# Patient Record
Sex: Male | Born: 1980 | Race: Black or African American | Hispanic: No | Marital: Married | Smoking: Current every day smoker
Health system: Southern US, Community
[De-identification: ages and names within clinical notes are randomized; demographics above are authoritative.]

## PROBLEM LIST (undated history)

## (undated) DIAGNOSIS — D573 Sickle-cell trait: Secondary | ICD-10-CM

## (undated) DIAGNOSIS — I428 Other cardiomyopathies: Secondary | ICD-10-CM

## (undated) DIAGNOSIS — Z91148 Patient's other noncompliance with medication regimen for other reason: Secondary | ICD-10-CM

## (undated) DIAGNOSIS — I420 Dilated cardiomyopathy: Secondary | ICD-10-CM

## (undated) DIAGNOSIS — M729 Fibroblastic disorder, unspecified: Secondary | ICD-10-CM

## (undated) DIAGNOSIS — I10 Essential (primary) hypertension: Secondary | ICD-10-CM

## (undated) DIAGNOSIS — Z9889 Other specified postprocedural states: Secondary | ICD-10-CM

## (undated) DIAGNOSIS — Z9114 Patient's other noncompliance with medication regimen: Secondary | ICD-10-CM

---

## 2005-10-13 ENCOUNTER — Emergency Department (HOSPITAL_COMMUNITY): Admission: EM | Admit: 2005-10-13 | Discharge: 2005-10-13 | Payer: Self-pay | Admitting: Emergency Medicine

## 2005-10-23 ENCOUNTER — Emergency Department (HOSPITAL_COMMUNITY): Admission: EM | Admit: 2005-10-23 | Discharge: 2005-10-23 | Payer: Self-pay | Admitting: Emergency Medicine

## 2005-10-25 ENCOUNTER — Emergency Department (HOSPITAL_COMMUNITY): Admission: EM | Admit: 2005-10-25 | Discharge: 2005-10-25 | Payer: Self-pay | Admitting: Emergency Medicine

## 2005-11-12 ENCOUNTER — Emergency Department (HOSPITAL_COMMUNITY): Admission: EM | Admit: 2005-11-12 | Discharge: 2005-11-12 | Payer: Self-pay | Admitting: Emergency Medicine

## 2005-11-14 ENCOUNTER — Emergency Department (HOSPITAL_COMMUNITY): Admission: EM | Admit: 2005-11-14 | Discharge: 2005-11-14 | Payer: Self-pay | Admitting: Emergency Medicine

## 2005-11-25 ENCOUNTER — Emergency Department (HOSPITAL_COMMUNITY): Admission: EM | Admit: 2005-11-25 | Discharge: 2005-11-25 | Payer: Self-pay | Admitting: Emergency Medicine

## 2005-11-27 ENCOUNTER — Emergency Department (HOSPITAL_COMMUNITY): Admission: EM | Admit: 2005-11-27 | Discharge: 2005-11-27 | Payer: Self-pay | Admitting: Emergency Medicine

## 2005-12-25 ENCOUNTER — Emergency Department (HOSPITAL_COMMUNITY): Admission: EM | Admit: 2005-12-25 | Discharge: 2005-12-25 | Payer: Self-pay | Admitting: Emergency Medicine

## 2006-01-12 ENCOUNTER — Emergency Department (HOSPITAL_COMMUNITY): Admission: EM | Admit: 2006-01-12 | Discharge: 2006-01-12 | Payer: Self-pay | Admitting: Emergency Medicine

## 2006-02-17 ENCOUNTER — Emergency Department (HOSPITAL_COMMUNITY): Admission: EM | Admit: 2006-02-17 | Discharge: 2006-02-17 | Payer: Self-pay | Admitting: Emergency Medicine

## 2015-01-18 ENCOUNTER — Emergency Department (HOSPITAL_COMMUNITY)
Admission: EM | Admit: 2015-01-18 | Discharge: 2015-01-19 | Disposition: A | Discharge status: Home / Self Care | Payer: Self-pay | Attending: Emergency Medicine | Admitting: Emergency Medicine

## 2015-01-18 ENCOUNTER — Encounter (HOSPITAL_COMMUNITY): Payer: Self-pay | Admitting: Emergency Medicine

## 2015-01-18 DIAGNOSIS — I10 Essential (primary) hypertension: Secondary | ICD-10-CM | POA: Insufficient documentation

## 2015-01-18 DIAGNOSIS — H538 Other visual disturbances: Secondary | ICD-10-CM | POA: Insufficient documentation

## 2015-01-18 DIAGNOSIS — Z720 Tobacco use: Secondary | ICD-10-CM | POA: Insufficient documentation

## 2015-01-18 LAB — CBC WITH DIFFERENTIAL/PLATELET
Basophils Absolute: 0.1 10*3/uL (ref 0.0–0.1)
Basophils Relative: 1 % (ref 0–1)
Eosinophils Absolute: 0.2 10*3/uL (ref 0.0–0.7)
Eosinophils Relative: 3 % (ref 0–5)
HCT: 49.1 % (ref 39.0–52.0)
Hemoglobin: 17.4 g/dL — ABNORMAL HIGH (ref 13.0–17.0)
Lymphocytes Relative: 32 % (ref 12–46)
Lymphs Abs: 1.9 10*3/uL (ref 0.7–4.0)
MCH: 30.7 pg (ref 26.0–34.0)
MCHC: 35.4 g/dL (ref 30.0–36.0)
MCV: 86.6 fL (ref 78.0–100.0)
Monocytes Absolute: 0.5 10*3/uL (ref 0.1–1.0)
Monocytes Relative: 9 % (ref 3–12)
Neutro Abs: 3.4 10*3/uL (ref 1.7–7.7)
Neutrophils Relative %: 55 % (ref 43–77)
Platelets: 170 10*3/uL (ref 150–400)
RBC: 5.67 MIL/uL (ref 4.22–5.81)
RDW: 14.1 % (ref 11.5–15.5)
WBC: 6.1 10*3/uL (ref 4.0–10.5)

## 2015-01-18 NOTE — ED Notes (Signed)
Patient reports he thinks his blood pressure is high. States he has never taken any medication for high blood pressure. Patient reports speaks of left eye and reports, "my vision got blurry and then I couldn't see out of it." States this happened 3 weeks ago.

## 2015-01-18 NOTE — ED Notes (Signed)
C/o pain at site of a "lump" just below left nipple, has been there for 2 months, also c/o blurry vision in right eye for 3-4 months. Also states he's been "peeing twice as much as he usually does. Also concerned about his insurance. States his wife is also here in the ER and "If I had to bring her up here I might as well get this all checked out"

## 2015-01-18 NOTE — ED Provider Notes (Signed)
CSN: [id number]     Arrival date & time 01/18/15  2146 History  This chart was scribed for John Rhine, MD by Tonye Royalty, ED Scribe. This patient was seen in room [facility] and the patient's care was started at 11:51 PM.    Chief Complaint  Patient presents with  . Hypertension  . Loss of Vision   Patient is a 34 y.o. male presenting with eye problem. The history is provided by the patient. No language interpreter was used.  Eye Problem Location:  L eye Severity:  Severe Onset quality:  Gradual Duration:  3 weeks Timing:  Constant Progression:  Worsening Chronicity:  New Context: not direct trauma   Relieved by:  None tried Worsened by:  Nothing tried Ineffective treatments:  None tried Associated symptoms: blurred vision and decreased vision   Associated symptoms: no headaches   Risk factors: no previous injury to eye     HPI Comments: John Cain is a 34 y.o. male who presents to the Emergency Department complaining of loss of vision in his left eye with onset 3 weeks ago. He states he is unable to make anything out, everything appears as a shadow. He states it worsened gradually and intermittently. He states vision was 20/20 before and denies injury.  He does not wear corrective lenses.  He stateas he has some weakness and tingling in left arm with onset 3 weeks ago that is resolved. He also reports lumps to lower left chest with onset 2 months ago, one of which is painful; he notes history of neurofibromatosis. He denies prior MI or CVA. He does not have a PCP. He states he has never used medication for HTN. His wife was being seen here, so he "figured [he] might as well get checked out." He denies headache, chest pain, or SOB.    PMH - suspect neurofibromatosis History  Substance Use Topics  . Smoking status: Current Every Day Smoker  . Smokeless tobacco: Not on file  . Alcohol Use: Yes     Comment: patient reports i drink on my days off, 2-3 days a week    Review  of Systems  Constitutional: Negative for fever.  Eyes: Positive for blurred vision and visual disturbance.  Respiratory: Negative for shortness of breath.   Cardiovascular: Negative for chest pain.  Neurological: Negative for headaches.  All other systems reviewed and are negative.     Allergies  Bee venom  Home Medications   Prior to Admission medications   Not on File   BP 164/118 mmHg  Pulse 108  Temp(Src) 97.8 F (36.6 C) (Oral)  Resp 20  Ht  (1.88 m)  Wt 210 lb (95.255 kg)  BMI 26.95 kg/m2  SpO2 99% Physical Exam  Nursing note and vitals reviewed.  CONSTITUTIONAL: Well developed/well nourished HEAD: Normocephalic/atraumatic EYES: EOMI/PERRL, no nystagmus,no ptosis, no corneal hazing, no evidence of trauma, no papilledema, no obvious abnormality noted on limited fundoscopic exam ENMT: Mucous membranes moist NECK: supple no meningeal signs, no bruits CV: S1/S2 noted, no murmurs/rubs/gallops noted LUNGS: Lungs are clear to auscultation bilaterally, no apparent distress ABDOMEN: soft, nontender, no rebound or guarding GU:no cva tenderness NEURO:Awake/alert, facies symmetric, no arm or leg drift is noted Equal 5/5 strength with shoulder abduction, elbow flex/extension, wrist flex/extension in upper extremities and equal hand grips bilaterally Equal 5/5 strength with hip flexion,knee flex/extension, foot dorsi/plantar flexion Cranial nerves grossly intact Gait normal without ataxia No past pointing Sensation to light touch intact in all extremities  EXTREMITIES: pulses normal, full ROM SKIN: warm, color normal.  No abscess/cellulitis noted to chest.  Scatted lesions throughoutt body, likely from NF PSYCH: no abnormalities of mood noted   ED Course  Procedures   DIAGNOSTIC STUDIES: Oxygen Saturation is 99% on room air, normal by my interpretation.    COORDINATION OF CARE: 12:00 AM Discussed treatment plan with patient at beside, including CT scan of his  head and follow ups with PCP, neurologist, and eye specialist. The patient agrees with the plan and has no further questions at this time.  Visual acuity noted/reviewed Pt ambulatory and tried to leave several times prior to completion of workup Unclear cause of vision loss.  He has no obvious cause on my exam,however he needs detailed NEURO and EYE followup.  Also advised need to have his likely HTN managed and needs PCP.   Other than visual loss, he has no signs of acute CVA as no weakness/numbness on my evaluation  Labs Review Labs Reviewed  COMPREHENSIVE METABOLIC PANEL - Abnormal; Notable for the following:    Glucose, Bld 102 (*)    All other components within normal limits  CBC WITH DIFFERENTIAL/PLATELET - Abnormal; Notable for the following:    Hemoglobin 17.4 (*)    All other components within normal limits    Imaging Review Ct Head Wo Contrast  01/19/2015   CLINICAL DATA:  Subacute onset of left-sided visual loss, with left arm weakness and tingling. Initial encounter.  EXAM: CT HEAD WITHOUT CONTRAST  TECHNIQUE: Contiguous axial images were obtained from the base of the skull through the vertex without intravenous contrast.  COMPARISON:  None.  FINDINGS: There is no evidence of acute infarction, mass lesion, or intra- or extra-axial hemorrhage on CT.  The posterior fossa, including the cerebellum, brainstem and fourth ventricle, is within normal limits. The third and lateral ventricles, and basal ganglia are unremarkable in appearance. The cerebral hemispheres are symmetric in appearance, with normal gray-white differentiation. No mass effect or midline shift is seen.  There is no evidence of fracture; visualized osseous structures are unremarkable in appearance. The orbits are within normal limits. The paranasal sinuses and mastoid air cells are well-aerated. No significant soft tissue abnormalities are seen.  IMPRESSION: Unremarkable noncontrast CT of the head.   Electronically Signed    By: Roanna Raider M.D.   On: 01/19/2015 01:00    MDM   Final diagnoses:  Essential hypertension  Blurred vision    Nursing notes including past medical history and social history reviewed and considered in documentation Labs/vital reviewed myself and considered during evaluation    I personally performed the services described in this documentation, which was scribed in my presence. The recorded information has been reviewed and is accurate.      John Rhine, MD 01/19/15 [phone]

## 2015-01-19 ENCOUNTER — Emergency Department (HOSPITAL_COMMUNITY): Payer: Self-pay

## 2015-01-19 LAB — COMPREHENSIVE METABOLIC PANEL
ALT: 53 U/L (ref 0–53)
AST: 27 U/L (ref 0–37)
Albumin: 4.9 g/dL (ref 3.5–5.2)
Alkaline Phosphatase: 54 U/L (ref 39–117)
Anion gap: 9 (ref 5–15)
BUN: 12 mg/dL (ref 6–23)
CO2: 25 mmol/L (ref 19–32)
Calcium: 8.9 mg/dL (ref 8.4–10.5)
Chloride: 107 mmol/L (ref 96–112)
Creatinine, Ser: 0.93 mg/dL (ref 0.50–1.35)
GFR calc Af Amer: >90 mL/min (ref >90)
GFR calc non Af Amer: >90 mL/min (ref >90)
Glucose, Bld: 102 mg/dL — ABNORMAL HIGH (ref 70–99)
Potassium: 4.4 mmol/L (ref 3.5–5.1)
Sodium: 141 mmol/L (ref 135–145)
Total Bilirubin: 0.6 mg/dL (ref 0.3–1.2)
Total Protein: 8 g/dL (ref 6.0–8.3)

## 2015-01-19 NOTE — ED Notes (Signed)
Went over labs with patient and stressed the importance of establishing himself with a primary doctor to manage his hypertension. Pt states both his wife and another family member who is a nurse have been after him to quit drinking and get his blood pressure checked. I encouraged him to listen to them. Pt states he only smokes when he drinks and acknowledges that he probably should quit drinking. States he will make appt with referrals given.

## 2015-10-19 DIAGNOSIS — Z9889 Other specified postprocedural states: Secondary | ICD-10-CM

## 2015-10-19 HISTORY — DX: Other specified postprocedural states: Z98.890

## 2016-01-27 ENCOUNTER — Inpatient Hospital Stay (HOSPITAL_COMMUNITY)
Admission: EM | Admit: 2016-01-27 | Discharge: 2016-01-30 | DRG: 287 | Disposition: A | Discharge status: Home / Self Care | Payer: PRIVATE HEALTH INSURANCE | Attending: Internal Medicine | Admitting: Internal Medicine

## 2016-01-27 ENCOUNTER — Emergency Department (HOSPITAL_COMMUNITY): Payer: PRIVATE HEALTH INSURANCE

## 2016-01-27 ENCOUNTER — Encounter (HOSPITAL_COMMUNITY): Payer: Self-pay | Admitting: Emergency Medicine

## 2016-01-27 DIAGNOSIS — F17200 Nicotine dependence, unspecified, uncomplicated: Secondary | ICD-10-CM | POA: Diagnosis present

## 2016-01-27 DIAGNOSIS — I160 Hypertensive urgency: Secondary | ICD-10-CM | POA: Diagnosis present

## 2016-01-27 DIAGNOSIS — R0902 Hypoxemia: Secondary | ICD-10-CM

## 2016-01-27 DIAGNOSIS — H21569 Pupillary abnormality, unspecified eye: Secondary | ICD-10-CM | POA: Insufficient documentation

## 2016-01-27 DIAGNOSIS — D649 Anemia, unspecified: Secondary | ICD-10-CM

## 2016-01-27 DIAGNOSIS — H5010 Unspecified exotropia: Secondary | ICD-10-CM | POA: Diagnosis present

## 2016-01-27 DIAGNOSIS — R072 Precordial pain: Secondary | ICD-10-CM

## 2016-01-27 DIAGNOSIS — I429 Cardiomyopathy, unspecified: Secondary | ICD-10-CM

## 2016-01-27 DIAGNOSIS — Q8500 Neurofibromatosis, unspecified: Secondary | ICD-10-CM

## 2016-01-27 DIAGNOSIS — D573 Sickle-cell trait: Secondary | ICD-10-CM | POA: Diagnosis present

## 2016-01-27 DIAGNOSIS — I161 Hypertensive emergency: Secondary | ICD-10-CM | POA: Diagnosis present

## 2016-01-27 DIAGNOSIS — G839 Paralytic syndrome, unspecified: Secondary | ICD-10-CM | POA: Diagnosis present

## 2016-01-27 DIAGNOSIS — R9431 Abnormal electrocardiogram [ECG] [EKG]: Secondary | ICD-10-CM

## 2016-01-27 DIAGNOSIS — Z79899 Other long term (current) drug therapy: Secondary | ICD-10-CM

## 2016-01-27 DIAGNOSIS — I428 Other cardiomyopathies: Secondary | ICD-10-CM | POA: Insufficient documentation

## 2016-01-27 DIAGNOSIS — I420 Dilated cardiomyopathy: Secondary | ICD-10-CM | POA: Diagnosis present

## 2016-01-27 DIAGNOSIS — Z7982 Long term (current) use of aspirin: Secondary | ICD-10-CM

## 2016-01-27 DIAGNOSIS — I10 Essential (primary) hypertension: Secondary | ICD-10-CM

## 2016-01-27 DIAGNOSIS — H4900 Third [oculomotor] nerve palsy, unspecified eye: Secondary | ICD-10-CM

## 2016-01-27 DIAGNOSIS — R079 Chest pain, unspecified: Secondary | ICD-10-CM | POA: Diagnosis present

## 2016-01-27 HISTORY — DX: Sickle-cell trait: D57.3

## 2016-01-27 HISTORY — DX: Fibroblastic disorder, unspecified: M72.9

## 2016-01-27 LAB — RAPID URINE DRUG SCREEN, HOSP PERFORMED
Amphetamines: NOT DETECTED
Barbiturates: NOT DETECTED
Benzodiazepines: NOT DETECTED
Cocaine: NOT DETECTED
Opiates: NOT DETECTED
Tetrahydrocannabinol: NOT DETECTED

## 2016-01-27 LAB — BRAIN NATRIURETIC PEPTIDE: B Natriuretic Peptide: 205 pg/mL — ABNORMAL HIGH (ref 0.0–100.0)

## 2016-01-27 LAB — I-STAT TROPONIN, ED
Troponin i, poc: 0.02 ng/mL (ref 0.00–0.08)
Troponin i, poc: 0.04 ng/mL (ref 0.00–0.08)

## 2016-01-27 LAB — CBC
HCT: 50.4 % (ref 39.0–52.0)
Hemoglobin: 18 g/dL — ABNORMAL HIGH (ref 13.0–17.0)
MCH: 30.4 pg (ref 26.0–34.0)
MCHC: 35.7 g/dL (ref 30.0–36.0)
MCV: 85 fL (ref 78.0–100.0)
Platelets: 108 10*3/uL — ABNORMAL LOW (ref 150–400)
RBC: 5.93 MIL/uL — ABNORMAL HIGH (ref 4.22–5.81)
RDW: 14.2 % (ref 11.5–15.5)
WBC: 7.8 10*3/uL (ref 4.0–10.5)

## 2016-01-27 LAB — BASIC METABOLIC PANEL
Anion gap: 8 (ref 5–15)
BUN: 11 mg/dL (ref 6–20)
CO2: 22 mmol/L (ref 22–32)
Calcium: 8.6 mg/dL — ABNORMAL LOW (ref 8.9–10.3)
Chloride: 109 mmol/L (ref 101–111)
Creatinine, Ser: 1.05 mg/dL (ref 0.61–1.24)
GFR calc Af Amer: >60 mL/min (ref >60)
GFR calc non Af Amer: >60 mL/min (ref >60)
Glucose, Bld: 118 mg/dL — ABNORMAL HIGH (ref 65–99)
Potassium: 4 mmol/L (ref 3.5–5.1)
Sodium: 139 mmol/L (ref 135–145)

## 2016-01-27 LAB — TROPONIN I: Troponin I: <0.03 ng/mL (ref <0.031)

## 2016-01-27 MED ORDER — NITROGLYCERIN IN D5W 200-5 MCG/ML-% IV SOLN
10.0000 ug/min | INTRAVENOUS | Status: DC
  Administered 2016-01-27: 10 ug/min via INTRAVENOUS
  Administered 2016-01-27: 40 ug/min via INTRAVENOUS
  Filled 2016-01-27: qty 250

## 2016-01-27 MED ORDER — LABETALOL HCL 5 MG/ML IV SOLN
10.0000 mg | INTRAVENOUS | Status: DC | PRN
  Administered 2016-01-27: 10 mg via INTRAVENOUS
  Filled 2016-01-27: qty 4

## 2016-01-27 MED ORDER — NITROGLYCERIN 0.4 MG SL SUBL
0.4000 mg | SUBLINGUAL_TABLET | SUBLINGUAL | Status: AC | PRN
  Administered 2016-01-27 (×3): 0.4 mg via SUBLINGUAL
  Filled 2016-01-27: qty 1

## 2016-01-27 MED ORDER — IOPAMIDOL (ISOVUE-370) INJECTION 76%
100.0000 mL | Freq: Once | INTRAVENOUS | Status: AC | PRN
  Administered 2016-01-27: 100 mL via INTRAVENOUS

## 2016-01-27 MED ORDER — ASPIRIN 81 MG PO CHEW
324.0000 mg | CHEWABLE_TABLET | Freq: Once | ORAL | Status: AC
  Administered 2016-01-27: 324 mg via ORAL
  Filled 2016-01-27: qty 4

## 2016-01-27 NOTE — H&P (Signed)
HPI: 35 yo man with h/o sickle trait and neurofibromatosis who presents as a transfer from [facility] with chest pain.  He reports pain started this morning around 10am, severe heaviness in chest with associated SOB.  No other symptoms.  Discomfort was severe enough that he went to ED for evaluation.  On arrival, vitals 173/116, HR 116 and ECG with ST, LAD, and LVH with repolarization abnormalities.  Initial labs notable for elevated Hg, negative troponin and UDS.  CTA chest also negative.  Case discussed with cardiology earlier today and patient transferred.  On arrival here he is on a NTG drip at 40 mcg, hypertensive and chest pain free.  He states that his pain resolved about 2 hours prior to arriving here.  Of note, no known family history as he is adopted.     Review of Systems:     Cardiac Review of Systems: {Y] = yes  = no  Chest Pain [    ]  Resting SOB [   ] Exertional SOB  [  ]  Orthopnea [  ]   Pedal Edema [   ]    Palpitations [  ] Syncope  [  ]   Presyncope [   ]  General Review of Systems: [Y] = yes [  ]=no Constitional: recent weight change [  ]; anorexia [  ]; fatigue [  ]; nausea [  ]; night sweats [  ]; fever [  ]; or chills [  ];                                                                     Dental: poor dentition[  ];   Eye : blurred vision [  ]; diplopia [   ]; vision changes [  ];  Amaurosis fugax[  ]; Resp: cough [  ];  wheezing[  ];  hemoptysis[  ]; shortness of breath[  ]; paroxysmal nocturnal dyspnea[  ]; dyspnea on exertion[  ]; or orthopnea[  ];  GI:  gallstones[  ], vomiting[  ];  dysphagia[  ]; melena[  ];  hematochezia [  ]; heartburn[  ];   GU: kidney stones [  ]; hematuria[  ];   dysuria [  ];  nocturia[  ];               Skin: rash [  ], swelling[  ];, hair loss[  ];  peripheral edema[  ];  or itching[  ]; Musculosketetal: myalgias[  ];  joint swelling[  ];  joint erythema[  ];  joint pain[  ];  back pain[  ];  Heme/Lymph: bruising[  ];  bleeding[  ];   anemia[  ];  Neuro: TIA[  ];  headaches[  ];  stroke[  ];  vertigo[  ];  seizures[  ];   paresthesias[  ];  difficulty walking[  ];  Psych:depression[  ]; anxiety[  ];  Endocrine: diabetes[  ];  thyroid dysfunction[  ];  Other:  Past Medical History  Diagnosis Date  . Sickle cell trait (HCC)   . Fibromatosis     No current facility-administered medications on file prior to encounter.   No current outpatient prescriptions on file prior to encounter.  Allergies  Allergen Reactions  . Bee Venom Hives    Social History   Social History  . Marital Status: Married    Spouse Name: N/A  . Number of Children: N/A  . Years of Education: N/A   Occupational History  . Not on file.   Social History Main Topics  . Smoking status: Current Every Day Smoker  . Smokeless tobacco: Not on file  . Alcohol Use: Yes     Comment: patient reports i drink on my days off, 2-3 days a week  . Drug Use: No  . Sexual Activity: Not on file   Other Topics Concern  . Not on file   Social History Narrative    History reviewed. No pertinent family history.  PHYSICAL EXAM: Filed Vitals:   01/27/16 2215 01/27/16 2230  BP: 151/121 165/113  Pulse: 99 98  Temp:    Resp: 26 25   General:  Well appearing. No respiratory difficulty.  Overweight.  HEENT: AT, [place], PERRL, EOMI, anicteric, clear OP, moist MM Neck: supple. no JVD. Carotids 2+ bilat; no bruits. No lymphadenopathy or thryomegaly appreciated. Cor: PMI nondisplaced. Regular rate & rhythm. No rubs, murmurs.  +S4 gallop with normal S1, S2 Lungs: clear Abdomen: soft, nontender, nondistended. No hepatosplenomegaly. No bruits or masses. Good bowel sounds. Extremities: no cyanosis, clubbing, rash, edema Neuro: alert & oriented x 3, cranial nerves grossly intact. moves all 4 extremities w/o difficulty. Affect pleasant.  ECG: SR, LAD, LVH with repol abn.  Results for orders placed or performed during the hospital encounter of 01/27/16  (from the past 24 hour(s))  Basic metabolic panel     Status: Abnormal   Collection Time: 01/27/16  2:34 PM  Result Value Ref Range   Sodium 139 135 - 145 mmol/L   Potassium 4.0 3.5 - 5.1 mmol/L   Chloride 109 101 - 111 mmol/L   CO2 22 22 - 32 mmol/L   Glucose, Bld 118 (H) 65 - 99 mg/dL   BUN 11 6 - 20 mg/dL   Creatinine, Ser 0.98 0.61 - 1.24 mg/dL   Calcium 8.6 (L) 8.9 - 10.3 mg/dL   GFR calc non Af Amer >60 >60 mL/min   GFR calc Af Amer >60 >60 mL/min   Anion gap 8 5 - 15  CBC     Status: Abnormal   Collection Time: 01/27/16  2:34 PM  Result Value Ref Range   WBC 7.8 4.0 - 10.5 K/uL   RBC 5.93 (H) 4.22 - 5.81 MIL/uL   Hemoglobin 18.0 (H) 13.0 - 17.0 g/dL   HCT 11.9 14.7 - 82.9 %   MCV 85.0 78.0 - 100.0 fL   MCH 30.4 26.0 - 34.0 pg   MCHC 35.7 30.0 - 36.0 g/dL   RDW 56.2 13.0 - 86.5 %   Platelets 108 (L) 150 - 400 K/uL  Troponin I     Status: None   Collection Time: 01/27/16  2:34 PM  Result Value Ref Range   Troponin I <0.03 <0.031 ng/mL  I-stat troponin, ED     Status: None   Collection Time: 01/27/16  2:50 PM  Result Value Ref Range   Troponin i, poc 0.02 0.00 - 0.08 ng/mL   Comment 3          Brain natriuretic peptide     Status: Abnormal   Collection Time: 01/27/16  2:51 PM  Result Value Ref Range   B Natriuretic Peptide 205.0 (H) 0.0 - 100.0 pg/mL  Urine rapid  drug screen (hosp performed)     Status: None   Collection Time: 01/27/16  3:34 PM  Result Value Ref Range   Opiates NONE DETECTED NONE DETECTED   Cocaine NONE DETECTED NONE DETECTED   Benzodiazepines NONE DETECTED NONE DETECTED   Amphetamines NONE DETECTED NONE DETECTED   Tetrahydrocannabinol NONE DETECTED NONE DETECTED   Barbiturates NONE DETECTED NONE DETECTED  I-stat troponin, ED     Status: None   Collection Time: 01/27/16  6:33 PM  Result Value Ref Range   Troponin i, poc 0.04 0.00 - 0.08 ng/mL   Comment 3           Ct Angio Chest Pe W/cm &/or Wo Cm  01/27/2016  CLINICAL DATA:  Chest pain and  shortness of Breath EXAM: CT ANGIOGRAPHY CHEST WITH CONTRAST TECHNIQUE: Multidetector CT imaging of the chest was performed using the standard protocol during bolus administration of intravenous contrast. Multiplanar CT image reconstructions and MIPs were obtained to evaluate the vascular anatomy. CONTRAST:  100 mL Isovue 370. COMPARISON:  None. FINDINGS: Lungs are well aerated bilaterally. No focal infiltrate or sizable effusion is seen. Diffuse emphysematous changes are seen. The thoracic inlet is within normal limits. The thoracic aorta shows no aneurysmal dilatation or dissection. The pulmonary artery shows a normal branching pattern without focal filling defect to suggest pulmonary embolism. No significant hilar or mediastinal adenopathy is noted. A few small aortic or pulmonary window nodes are seen. Scanning into the upper abdomen shows no acute abnormality. The bony structures show no acute abnormality. Review of the MIP images confirms the above findings. IMPRESSION: No evidence of pulmonary emboli. Diffuse emphysematous changes. Electronically Signed   By: Alcide Clever M.D.   On: 01/27/2016 17:53   Dg Chest Port 1 View  01/27/2016  CLINICAL DATA:  Chest pain EXAM: PORTABLE CHEST 1 VIEW COMPARISON:  None. FINDINGS: Cardiac shadow is within normal limits. The lungs are well aerated bilaterally. Increased density is noted in the medial right lung base which may represent some early infiltrate. No acute bony abnormality is noted. IMPRESSION: Likely early infiltrate in the medial right lung base. Electronically Signed   By: Alcide Clever M.D.   On: 01/27/2016 15:49     ASSESSMENT: 35 yo man with h/o sickle trait and neurofibromatosis who presents with CP in setting of markedly elevated BP.  He has LVH on ECG which makes undiagnosed/untreated hypertension likely.  His CP is likely due to hypertensive urgency as it lasted close to 12 hours with negative troponins.    PLAN/DISCUSSION: Continue NTG drip,  titrate for BP Will add labetalol 10 mg IV prn HCTZ 25 mg po starting now Norvasc 10 mg po starting now Will need echo given HTN, LVH on ECG and S4 gallop on exam Check troponin x 1 now Risk stratify with A1c and lipids VTE ppx Will likely need ischemic eval, would suggest non-invasive testing once BP better controlled.

## 2016-01-27 NOTE — ED Notes (Signed)
Woke up this am with chest pain, discomfort is a heaviness.  No radiating and c/o SOB.

## 2016-01-27 NOTE — ED Provider Notes (Addendum)
CSN: [id number]     Arrival date & time 01/27/16  1421 History   First MD Initiated Contact with Patient 01/27/16 1435     Chief Complaint  Patient presents with  . Chest Pain     (Consider location/radiation/quality/duration/timing/severity/associated sxs/prior Treatment) HPI Patient presents with left-sided chest pressure starting this morning. It was associated with shortness of breath. Pressure is worse when lying flat. Also increased dyspnea with any exertion. Nonproductive cough endorse today. No fever or chills. No recent illness. No known sick contacts. No lower extremity asymmetry or swelling. No recent extended travel or immobilization. Unknown family history due to being adopted. She does not smoke 6-7 cigarettes a day for the last 15 years. Has a history of high blood pressure but is currently on no medications for this. History reviewed. No pertinent past medical history. History reviewed. No pertinent past surgical history. History reviewed. No pertinent family history. Social History  Substance Use Topics  . Smoking status: Current Every Day Smoker  . Smokeless tobacco: None  . Alcohol Use: Yes     Comment: patient reports i drink on my days off, 2-3 days a week    Review of Systems  Constitutional: Negative for fever and chills.  Eyes: Negative for visual disturbance.  Respiratory: Positive for cough, chest tightness and shortness of breath.   Cardiovascular: Negative for chest pain, palpitations and leg swelling.  Gastrointestinal: Negative for nausea, vomiting, abdominal pain and diarrhea.  Musculoskeletal: Negative for back pain and neck pain.  Skin: Negative for rash and wound.  Neurological: Negative for dizziness, weakness, light-headedness, numbness and headaches.  All other systems reviewed and are negative.     Allergies  Bee venom  Home Medications   Prior to Admission medications   Not on File   BP 153/120 mmHg  Pulse 92  Temp(Src) 98.1 F  (36.7 C) (Oral)  Resp 28  Ht 6\' 2"  (1.88 m)  Wt 230 lb (104.327 kg)  BMI 29.52 kg/m2  SpO2 100% Physical Exam  Constitutional: He is oriented to person, place, and time. He appears well-developed and well-nourished. No distress.  HENT:  Head: Normocephalic and atraumatic.  Mouth/Throat: Oropharynx is clear and moist.  Eyes: EOM are normal. Pupils are equal, round, and reactive to light.  Neck: Normal range of motion. Neck supple.  Cardiovascular: Regular rhythm.  Exam reveals friction rub.   Tachycardia  Pulmonary/Chest: No respiratory distress. He has no wheezes. He has rales. He exhibits no tenderness.  Patient with fine crackles in the right base. Tachypnea  Abdominal: Soft. Bowel sounds are normal. He exhibits no distension and no mass. There is no tenderness. There is no rebound and no guarding.  Musculoskeletal: Normal range of motion. He exhibits no edema or tenderness.  Swelling, asymmetry or tenderness. Distal pulses are 3+ and equal.  Neurological: He is alert and oriented to person, place, and time.  5/5 motor in all extremities. Sensation is fully intact.  Skin: Skin is warm and dry. No rash noted. No erythema.  Psychiatric: He has a normal mood and affect. His behavior is normal.  Nursing note and vitals reviewed.   ED Course  Procedures (including critical care time) Labs Review Labs Reviewed  BASIC METABOLIC PANEL - Abnormal; Notable for the following:    Glucose, Bld 118 (*)    Calcium 8.6 (*)    All other components within normal limits  CBC - Abnormal; Notable for the following:    RBC 5.93 (*)    Hemoglobin  18.0 (*)    Platelets 108 (*)    All other components within normal limits  BRAIN NATRIURETIC PEPTIDE - Abnormal; Notable for the following:    B Natriuretic Peptide 205.0 (*)    All other components within normal limits  TROPONIN I  URINE RAPID DRUG SCREEN, HOSP PERFORMED  I-STAT TROPOININ, ED  I-STAT TROPOININ, ED    Imaging Review Ct Angio  Chest Pe W/cm &/or Wo Cm  01/27/2016  CLINICAL DATA:  Chest pain and shortness of Breath EXAM: CT ANGIOGRAPHY CHEST WITH CONTRAST TECHNIQUE: Multidetector CT imaging of the chest was performed using the standard protocol during bolus administration of intravenous contrast. Multiplanar CT image reconstructions and MIPs were obtained to evaluate the vascular anatomy. CONTRAST:  100 mL Isovue 370. COMPARISON:  None. FINDINGS: Lungs are well aerated bilaterally. No focal infiltrate or sizable effusion is seen. Diffuse emphysematous changes are seen. The thoracic inlet is within normal limits. The thoracic aorta shows no aneurysmal dilatation or dissection. The pulmonary artery shows a normal branching pattern without focal filling defect to suggest pulmonary embolism. No significant hilar or mediastinal adenopathy is noted. A few small aortic or pulmonary window nodes are seen. Scanning into the upper abdomen shows no acute abnormality. The bony structures show no acute abnormality. Review of the MIP images confirms the above findings. IMPRESSION: No evidence of pulmonary emboli. Diffuse emphysematous changes. Electronically Signed   By: Alcide Clever M.D.   On: 01/27/2016 17:53   Dg Chest Port 1 View  01/27/2016  CLINICAL DATA:  Chest pain EXAM: PORTABLE CHEST 1 VIEW COMPARISON:  None. FINDINGS: Cardiac shadow is within normal limits. The lungs are well aerated bilaterally. Increased density is noted in the medial right lung base which may represent some early infiltrate. No acute bony abnormality is noted. IMPRESSION: Likely early infiltrate in the medial right lung base. Electronically Signed   By: Alcide Clever M.D.   On: 01/27/2016 15:49   I have personally reviewed and evaluated these images and lab results as part of my medical decision-making.   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 There is evidence of T-wave inversion and ST segment depression V6 and V5. Does not meet STEMI criteria. Repeat EKG is unchanged. Patient given sublingual nitroglycerin and aspirin in the emergency department. States that the pressure in his chest has improved significantly. Blood pressure has also normalized. Patient with desaturations into the 80s with mild exertion. Placed on supplement oxygen.  Chest extremity with questionable right basilar infiltrate. CT angios the chest without evidence of PE. No infiltrate noted. Patient states his chest pressure has improved but worsens with exertion. Discussed with Dr. Purvis Sheffield. Accept in transfer to [facility] step down bed.  Loren Racer, MD 01/27/16 1926  CRITICAL CARE Performed by: Loren Racer Total critical care time:50 minutes Critical care time was exclusive of separately billable procedures and treating other patients. Critical care was necessary to treat or  prevent imminent or life-threatening deterioration. Critical care was time spent personally by me on the following activities: development of treatment plan with patient and/or surrogate as well as nursing, discussions with consultants, evaluation of patient's response to treatment, examination of patient, obtaining history from patient or surrogate, ordering and performing treatments and interventions, ordering and review of laboratory studies, ordering and review of radiographic studies, pulse oximetry and re-evaluation of patient's condition.   Loren Racer, MD 01/27/16 [phone]

## 2016-01-28 ENCOUNTER — Inpatient Hospital Stay (HOSPITAL_COMMUNITY): Payer: PRIVATE HEALTH INSURANCE

## 2016-01-28 ENCOUNTER — Encounter (HOSPITAL_COMMUNITY): Payer: Self-pay | Admitting: Radiology

## 2016-01-28 DIAGNOSIS — R072 Precordial pain: Secondary | ICD-10-CM | POA: Diagnosis not present

## 2016-01-28 DIAGNOSIS — H50112 Monocular exotropia, left eye: Secondary | ICD-10-CM | POA: Diagnosis present

## 2016-01-28 DIAGNOSIS — I161 Hypertensive emergency: Principal | ICD-10-CM

## 2016-01-28 DIAGNOSIS — H5010 Unspecified exotropia: Secondary | ICD-10-CM | POA: Diagnosis present

## 2016-01-28 DIAGNOSIS — I10 Essential (primary) hypertension: Secondary | ICD-10-CM | POA: Diagnosis not present

## 2016-01-28 DIAGNOSIS — H21562 Pupillary abnormality, left eye: Secondary | ICD-10-CM | POA: Diagnosis not present

## 2016-01-28 LAB — CBC
HCT: 44 % (ref 39.0–52.0)
Hemoglobin: 15.6 g/dL (ref 13.0–17.0)
MCH: 30.2 pg (ref 26.0–34.0)
MCHC: 35.5 g/dL (ref 30.0–36.0)
MCV: 85.1 fL (ref 78.0–100.0)
Platelets: 136 10*3/uL — ABNORMAL LOW (ref 150–400)
RBC: 5.17 MIL/uL (ref 4.22–5.81)
RDW: 14.3 % (ref 11.5–15.5)
WBC: 8.4 10*3/uL (ref 4.0–10.5)

## 2016-01-28 LAB — BASIC METABOLIC PANEL
Anion gap: 11 (ref 5–15)
BUN: 10 mg/dL (ref 6–20)
CO2: 25 mmol/L (ref 22–32)
Calcium: 9.3 mg/dL (ref 8.9–10.3)
Chloride: 106 mmol/L (ref 101–111)
Creatinine, Ser: 1.19 mg/dL (ref 0.61–1.24)
GFR calc Af Amer: >60 mL/min (ref >60)
GFR calc non Af Amer: >60 mL/min (ref >60)
Glucose, Bld: 137 mg/dL — ABNORMAL HIGH (ref 65–99)
Potassium: 3.7 mmol/L (ref 3.5–5.1)
Sodium: 142 mmol/L (ref 135–145)

## 2016-01-28 LAB — LIPID PANEL
Cholesterol: 211 mg/dL — ABNORMAL HIGH (ref 0–200)
HDL: 79 mg/dL (ref >40)
LDL Cholesterol: 115 mg/dL — ABNORMAL HIGH (ref 0–99)
Total CHOL/HDL Ratio: 2.7 RATIO
Triglycerides: 86 mg/dL (ref <150)
VLDL: 17 mg/dL (ref 0–40)

## 2016-01-28 LAB — TROPONIN I: Troponin I: <0.03 ng/mL (ref <0.031)

## 2016-01-28 LAB — MRSA PCR SCREENING: MRSA by PCR: NEGATIVE

## 2016-01-28 LAB — TSH: TSH: 1.838 u[IU]/mL (ref 0.350–4.500)

## 2016-01-28 LAB — ECHOCARDIOGRAM COMPLETE
Height: 74 in
Weight: 3385.6 oz

## 2016-01-28 MED ORDER — ONDANSETRON HCL 4 MG/2ML IJ SOLN: 4.0000 mg | Freq: Four times a day (QID) | INTRAMUSCULAR | Status: DC | PRN

## 2016-01-28 MED ORDER — ASPIRIN EC 81 MG PO TBEC
81.0000 mg | DELAYED_RELEASE_TABLET | Freq: Every day | ORAL | Status: DC
  Administered 2016-01-28 – 2016-01-30 (×3): 81 mg via ORAL
  Filled 2016-01-28 (×3): qty 1

## 2016-01-28 MED ORDER — ENOXAPARIN SODIUM 40 MG/0.4ML ~~LOC~~ SOLN
40.0000 mg | SUBCUTANEOUS | Status: DC
  Administered 2016-01-28 – 2016-01-30 (×3): 40 mg via SUBCUTANEOUS
  Filled 2016-01-28 (×3): qty 0.4

## 2016-01-28 MED ORDER — LABETALOL HCL 200 MG PO TABS
200.0000 mg | ORAL_TABLET | Freq: Two times a day (BID) | ORAL | Status: DC
  Administered 2016-01-28 (×2): 200 mg via ORAL
  Filled 2016-01-28 (×2): qty 1

## 2016-01-28 MED ORDER — PERFLUTREN LIPID MICROSPHERE
1.0000 mL | INTRAVENOUS | Status: AC | PRN
  Administered 2016-01-28: 2 mL via INTRAVENOUS
  Filled 2016-01-28: qty 10

## 2016-01-28 MED ORDER — IOPAMIDOL (ISOVUE-370) INJECTION 76%
INTRAVENOUS | Status: AC
  Administered 2016-01-28: 50 mL
  Filled 2016-01-28: qty 50

## 2016-01-28 MED ORDER — AMLODIPINE BESYLATE 10 MG PO TABS
10.0000 mg | ORAL_TABLET | Freq: Every day | ORAL | Status: DC
  Administered 2016-01-28 – 2016-01-30 (×3): 10 mg via ORAL
  Filled 2016-01-28 (×3): qty 1

## 2016-01-28 MED ORDER — GADOBENATE DIMEGLUMINE 529 MG/ML IV SOLN
20.0000 mL | Freq: Once | INTRAVENOUS | Status: AC | PRN
  Administered 2016-01-28: 20 mL via INTRAVENOUS

## 2016-01-28 MED ORDER — CHLORTHALIDONE 25 MG PO TABS
25.0000 mg | ORAL_TABLET | Freq: Every day | ORAL | Status: DC
  Administered 2016-01-28 – 2016-01-30 (×3): 25 mg via ORAL
  Filled 2016-01-28 (×4): qty 1

## 2016-01-28 MED ORDER — PERFLUTREN LIPID MICROSPHERE
INTRAVENOUS | Status: AC
  Filled 2016-01-28: qty 10

## 2016-01-28 MED ORDER — LIVING BETTER WITH HEART FAILURE BOOK
Freq: Once | Status: AC
  Administered 2016-01-28: 20:00:00

## 2016-01-28 MED ORDER — HYDROCHLOROTHIAZIDE 25 MG PO TABS
25.0000 mg | ORAL_TABLET | Freq: Every day | ORAL | Status: DC
  Administered 2016-01-28: 25 mg via ORAL
  Filled 2016-01-28: qty 1

## 2016-01-28 MED ORDER — IOPAMIDOL (ISOVUE-370) INJECTION 76%
INTRAVENOUS | Status: AC
  Filled 2016-01-28: qty 50

## 2016-01-28 MED ORDER — ACETAMINOPHEN 325 MG PO TABS: 650.0000 mg | ORAL_TABLET | ORAL | Status: DC | PRN

## 2016-01-28 MED ORDER — DIPHENHYDRAMINE HCL 25 MG PO CAPS
25.0000 mg | ORAL_CAPSULE | Freq: Every evening | ORAL | Status: AC | PRN
  Administered 2016-01-28 – 2016-01-29 (×2): 25 mg via ORAL
  Filled 2016-01-28 (×2): qty 1

## 2016-01-28 MED ORDER — HYDRALAZINE HCL 20 MG/ML IJ SOLN
10.0000 mg | Freq: Four times a day (QID) | INTRAMUSCULAR | Status: DC | PRN
  Administered 2016-01-28 (×2): 10 mg via INTRAVENOUS
  Filled 2016-01-28 (×2): qty 1

## 2016-01-28 NOTE — Consult Note (Signed)
Neurology Consultation Reason for Consult: Diplopia Referring Physician: Hilty, C  CC: Diplopia  History is obtained from: Patient  HPI: John Cain is a 35 y.o. male with a history of sickle cell trait and neurofibromatosis presents with worsening of his left eye vision changes. He states that people told him that his eyes were looking in the same direction. He has noticed decreased vision in that eye as well of the past few weeks. He states that he does get headaches from time to time, and has had headaches within the past couple of weeks but nothing unusual.  Yesterday, he woke up and had severe shortness of breath and presented to the emergency room was found to be in hypertensive emergency with chest pain.   LKW: Unclear tpa given?: no, and clear time of onset    ROS: A 14 point ROS was performed and is negative except as noted in the HPI.   Past Medical History  Diagnosis Date  . Sickle cell trait (HCC)   . Fibromatosis      Family history: Adopted   Social History:  reports that he has been smoking.  He does not have any smokeless tobacco history on file. He reports that he drinks alcohol. He reports that he does not use illicit drugs.   Exam: Current vital signs: BP 151/114 mmHg  Pulse 114  Temp(Src) 98.3 F (36.8 C) (Oral)  Resp 22  Ht 6\' 2"  (1.88 m)  Wt 95.981 kg (211 lb 9.6 oz)  BMI 27.16 kg/m2  SpO2 97% Vital signs in last 24 hours: Temp:  [97.9 F (36.6 C)-98.3 F (36.8 C)] 98.3 F (36.8 C) (04/12 0849) Pulse Rate:  [50-123] 114 (04/12 1000) Resp:  [16-34] 22 (04/12 1000) BP: (127-176)/(77-134) 151/114 mmHg (04/12 1000) SpO2:  [90 %-100 %] 97 % (04/12 1000) Weight:  [95.981 kg (211 lb 9.6 oz)-104.327 kg (230 lb)] 95.981 kg (211 lb 9.6 oz) (04/11 2140)   Physical Exam  Constitutional: Appears well-developed and well-nourished.  Psych: Affect appropriate to situation Eyes: No scleral injection HENT: No OP obstrucion Head: Normocephalic.   Cardiovascular: Normal rate and regular rhythm.  Respiratory: Effort normal and breath sounds normal to anterior ascultation GI: Soft.  No distension. There is no tenderness.  Skin: WDI  Neuro: Mental Status: Patient is awake, alert, oriented to person, place, month, year, and situation. Patient is able to give a clear and coherent history. No signs of aphasia or neglect Cranial Nerves: II: Visual Fields are full in the right eye. He has vision to hand movement in the left eye, but no fine definition. Pupils are unequal with the left being larger and sluggishly reactive III,IV, VI: His eyes are disconjugate with the left being slightly out and down compared to the right, but he does have the ability to move it in all directions at least to some degree V: Facial sensation is symmetric to temperature VII: Facial movement is notable for a right lower facial weakness on the right VIII: hearing is intact to voice X: Uvula elevates symmetrically XI: Shoulder shrug is symmetric. XII: tongue is midline without atrophy or fasciculations.  Motor: Tone is normal. Bulk is normal. 5/5 strength was present in all four extremities.  Sensory: Sensation is symmetric to light touch and temperature in the arms and legs. Cerebellar: FNF  intact bilaterally     I have reviewed labs in epic and the results pertinent to this consultation are: BMP-unremarkable  I have reviewed the images obtained: CT  head from 4/3-unremarkable  Impression: 35 year old male with painless gradual decreased visual acuity as well as partial third nerve palsy on the left. The fact that he also has decreased facial movement on the right does concern me for sometime intracranial process, however the character of the vision loss is much more typical for a ophthalmologic or optic nerve process.  Given his history of neurofibromatosis, I would be concerned for possible optic nerve glioma or other related process.  If an  evaluation is negative, then I think ophthalmologic evaluation will be the next prudent step.  Recommendations: 1) MRI brain, orbits with and without contrast 2) CT angiogram of the head and neck 3) will follow   Ritta Slot, MD [facility] [phone]  If 7pm- 7am, please page neurology on call as listed in AMION.

## 2016-01-28 NOTE — Care Management Note (Signed)
Case Management Note  Patient Details  Name: John Cain MRN: [id number] Date of Birth: 1981-01-08  Subjective/Objective:      Adm w ch pain              Action/Plan: lives at home   Expected Discharge Date:                  Expected Discharge Plan:  Home/Self Care  In-House Referral:     Discharge planning Services     Post Acute Care Choice:    Choice offered to:     DME Arranged:    DME Agency:     HH Arranged:    HH Agency:     Status of Service:     Medicare Important Message Given:    Date Medicare IM Given:    Medicare IM give by:    Date Additional Medicare IM Given:    Additional Medicare Important Message give by:     If discussed at Long Length of Stay Meetings, dates discussed:    Additional Comments: ur review done  Hanley Hays, RN 01/28/2016, 10:09 AM

## 2016-01-28 NOTE — Progress Notes (Signed)
  DAILY PROGRESS NOTE  Subjective:  Events noted yesterday - chest pain thought to be due to hypertensive urgency. Noted LVH by voltage on EKG. Blood pressure remains elevated at 150/114 this morning, he is tachycardic.  Objective:   Temp:  [97.9 F (36.6 C)-98.3 F (36.8 C)] 98.3 F (36.8 C) (04/12 0849) Pulse Rate:  [50-123] 114 (04/12 1000) Resp:  [16-34] 22 (04/12 1000) BP: (127-176)/(77-134) 151/114 mmHg (04/12 1000) SpO2:  [90 %-100 %] 97 % (04/12 1000) Weight:  [211 lb 9.6 oz (95.981 kg)-230 lb (104.327 kg)] 211 lb 9.6 oz (95.981 kg) (04/11 2140) Weight change:   Intake/Output from previous day: 04/11 0701 - 04/12 0700 In: 150.1 [I.V.:150.1] Out: 500 [Urine:500]  Intake/Output from this shift: Total I/O In: 265 [P.O.:240; I.V.:25] Out: 100 [Urine:100]  Medications: Current Facility-Administered Medications  Medication Dose Route Frequency Provider Last Rate Last Dose  . acetaminophen (TYLENOL) tablet 650 mg  650 mg Oral Q4H PRN Manrique Alvarez, MD      . amLODipine (NORVASC) tablet 10 mg  10 mg Oral Daily Manrique Alvarez, MD   10 mg at 01/28/16 0815  . aspirin EC tablet 81 mg  81 mg Oral Daily Manrique Alvarez, MD   81 mg at 01/28/16 0817  . enoxaparin (LOVENOX) injection 40 mg  40 mg Subcutaneous Q24H Manrique Alvarez, MD   40 mg at 01/28/16 0817  . hydrochlorothiazide (HYDRODIURIL) tablet 25 mg  25 mg Oral Daily Manrique Alvarez, MD   25 mg at 01/28/16 0817  . labetalol (NORMODYNE,TRANDATE) injection 10 mg  10 mg Intravenous Q2H PRN Manrique Alvarez, MD   10 mg at 01/27/16 2355  . nitroGLYCERIN 50 mg in dextrose 5 % 250 mL (0.2 mg/mL) infusion  10-200 mcg/min Intravenous Titrated David Yelverton, MD 10.5 mL/hr at 01/28/16 0839 35 mcg/min at 01/28/16 0839  . ondansetron (ZOFRAN) injection 4 mg  4 mg Intravenous Q6H PRN Manrique Alvarez, MD        Physical Exam: General appearance: alert and no distress Neck: no carotid bruit, no JVD and there is mild left  exotropia Lungs: clear to auscultation bilaterally Heart: regular rate and rhythm, S1, S2 normal and S3 present Abdomen: soft, non-tender; bowel sounds normal; no masses,  no organomegaly Extremities: extremities normal, atraumatic, no cyanosis or edema Pulses: 2+ and symmetric Skin: Skin color, texture, turgor normal. No rashes or lesions Neurologic: Mental status: Alert, oriented, thought content appropriate Cranial nerves:  I: smell Not tested  II: visual acuity  Not tested  II: visual fields Full to confrontation  II: pupils Equal, round, reactive to light  III,VII: ptosis None  III,IV,VI: extraocular muscles  L exotropia  V: mastication Normal  V: facial light touch sensation  Normal  V,VII: corneal reflex  Present  VII: facial muscle function - upper  Normal  VII: facial muscle function - lower Normal  VIII: hearing Not tested  IX: soft palate elevation  Normal  IX,X: gag reflex Present  XI: trapezius strength  5/5  XI: sternocleidomastoid strength 5/5  XI: neck flexion strength  5/5  XII: tongue strength  Normal   Lab Results: Results for orders placed or performed during the hospital encounter of 01/27/16 (from the past 48 hour(s))  Basic metabolic panel     Status: Abnormal   Collection Time: 01/27/16  2:34 PM  Result Value Ref Range   Sodium 139 135 - 145 mmol/L   Potassium 4.0 3.5 - 5.1 mmol/L   Chloride 109 101 - 111 mmol/L     CO2 22 22 - 32 mmol/L   Glucose, Bld 118 (H) 65 - 99 mg/dL   BUN 11 6 - 20 mg/dL   Creatinine, Ser 1.05 0.61 - 1.24 mg/dL   Calcium 8.6 (L) 8.9 - 10.3 mg/dL   GFR calc non Af Amer >60 >60 mL/min   GFR calc Af Amer >60 >60 mL/min    Comment: (NOTE) The eGFR has been calculated using the CKD EPI equation. This calculation has not been validated in all clinical situations. eGFR's persistently <60 mL/min signify possible Chronic Kidney Disease.    Anion gap 8 5 - 15  CBC     Status: Abnormal   Collection Time: 01/27/16  2:34 PM  Result  Value Ref Range   WBC 7.8 4.0 - 10.5 K/uL   RBC 5.93 (H) 4.22 - 5.81 MIL/uL   Hemoglobin 18.0 (H) 13.0 - 17.0 g/dL   HCT 50.4 39.0 - 52.0 %   MCV 85.0 78.0 - 100.0 fL   MCH 30.4 26.0 - 34.0 pg   MCHC 35.7 30.0 - 36.0 g/dL   RDW 14.2 11.5 - 15.5 %   Platelets 108 (L) 150 - 400 K/uL    Comment: SPECIMEN CHECKED FOR CLOTS PLATELET COUNT CONFIRMED BY SMEAR   Troponin I     Status: None   Collection Time: 01/27/16  2:34 PM  Result Value Ref Range   Troponin I <0.03 <0.031 ng/mL    Comment:        NO INDICATION OF MYOCARDIAL INJURY.   I-stat troponin, ED     Status: None   Collection Time: 01/27/16  2:50 PM  Result Value Ref Range   Troponin i, poc 0.02 0.00 - 0.08 ng/mL   Comment 3            Comment: Due to the release kinetics of cTnI, a negative result within the first hours of the onset of symptoms does not rule out myocardial infarction with certainty. If myocardial infarction is still suspected, repeat the test at appropriate intervals.   Brain natriuretic peptide     Status: Abnormal   Collection Time: 01/27/16  2:51 PM  Result Value Ref Range   B Natriuretic Peptide 205.0 (H) 0.0 - 100.0 pg/mL  Urine rapid drug screen (hosp performed)     Status: None   Collection Time: 01/27/16  3:34 PM  Result Value Ref Range   Opiates NONE DETECTED NONE DETECTED   Cocaine NONE DETECTED NONE DETECTED   Benzodiazepines NONE DETECTED NONE DETECTED   Amphetamines NONE DETECTED NONE DETECTED   Tetrahydrocannabinol NONE DETECTED NONE DETECTED   Barbiturates NONE DETECTED NONE DETECTED    Comment:        DRUG SCREEN FOR MEDICAL PURPOSES ONLY.  IF CONFIRMATION IS NEEDED FOR ANY PURPOSE, NOTIFY LAB WITHIN 5 DAYS.        LOWEST DETECTABLE LIMITS FOR URINE DRUG SCREEN Drug Class       Cutoff (ng/mL) Amphetamine      1000 Barbiturate      200 Benzodiazepine   166 Tricyclics       063 Opiates          300 Cocaine          300 THC              50   I-stat troponin, ED      Status: None   Collection Time: 01/27/16  6:33 PM  Result Value Ref Range   Troponin i, poc 0.04  0.00 - 0.08 ng/mL   Comment 3            Comment: Due to the release kinetics of cTnI, a negative result within the first hours of the onset of symptoms does not rule out myocardial infarction with certainty. If myocardial infarction is still suspected, repeat the test at appropriate intervals.   MRSA PCR Screening     Status: None   Collection Time: 01/27/16  9:41 PM  Result Value Ref Range   MRSA by PCR NEGATIVE NEGATIVE    Comment:        The GeneXpert MRSA Assay (FDA approved for NASAL specimens only), is one component of a comprehensive MRSA colonization surveillance program. It is not intended to diagnose MRSA infection nor to guide or monitor treatment for MRSA infections.     Imaging: Ct Angio Chest Pe W/cm &/or Wo Cm  01/27/2016  CLINICAL DATA:  Chest pain and shortness of Breath EXAM: CT ANGIOGRAPHY CHEST WITH CONTRAST TECHNIQUE: Multidetector CT imaging of the chest was performed using the standard protocol during bolus administration of intravenous contrast. Multiplanar CT image reconstructions and MIPs were obtained to evaluate the vascular anatomy. CONTRAST:  100 mL Isovue 370. COMPARISON:  None. FINDINGS: Lungs are well aerated bilaterally. No focal infiltrate or sizable effusion is seen. Diffuse emphysematous changes are seen. The thoracic inlet is within normal limits. The thoracic aorta shows no aneurysmal dilatation or dissection. The pulmonary artery shows a normal branching pattern without focal filling defect to suggest pulmonary embolism. No significant hilar or mediastinal adenopathy is noted. A few small aortic or pulmonary window nodes are seen. Scanning into the upper abdomen shows no acute abnormality. The bony structures show no acute abnormality. Review of the MIP images confirms the above findings. IMPRESSION: No evidence of pulmonary emboli. Diffuse  emphysematous changes. Electronically Signed   By: Mark  Lukens M.D.   On: 01/27/2016 17:53   Dg Chest Port 1 View  01/27/2016  CLINICAL DATA:  Chest pain EXAM: PORTABLE CHEST 1 VIEW COMPARISON:  None. FINDINGS: Cardiac shadow is within normal limits. The lungs are well aerated bilaterally. Increased density is noted in the medial right lung base which may represent some early infiltrate. No acute bony abnormality is noted. IMPRESSION: Likely early infiltrate in the medial right lung base. Electronically Signed   By: Mark  Lukens M.D.   On: 01/27/2016 15:49    Assessment:  Principal Problem:   Hypertensive emergency Active Problems:   Chest pain   Hypertensive urgency   Exotropia of left eye  Plan:  1. Blood pressure remains elevated, despite addition of amlodipine today. He is also tachycardic. Start po labetalol 200 mg BID now. Add chlorthalidone 25 mg daily. Continue amlodipine 10 mg daily. Plan for 2D echo today. Discussed subacute onset exotropia with Dr. Kirkpatrick, he will evaluate, but suspect CN 4 or 6 nerve palsy due to hypertensive emergency/small vessel disease. CT head was negative for bleed, mass or acute process.  Time Spent Directly with Patient:  15 minutes  Length of Stay:  LOS: 1 day   Kenneth C. Hilty, MD, FACC Attending Cardiologist [facility]  Kenneth C Hilty 01/28/2016, 10:25 AM     

## 2016-01-28 NOTE — Progress Notes (Signed)
Echocardiogram 2D Echocardiogram has been performed.  John Cain 01/28/2016, 11:33 AM

## 2016-01-29 ENCOUNTER — Encounter (HOSPITAL_COMMUNITY)
Admission: EM | Disposition: A | Discharge status: Home / Self Care | Payer: Self-pay | Source: Home / Self Care | Attending: Internal Medicine

## 2016-01-29 DIAGNOSIS — I161 Hypertensive emergency: Secondary | ICD-10-CM | POA: Diagnosis not present

## 2016-01-29 DIAGNOSIS — H21562 Pupillary abnormality, left eye: Secondary | ICD-10-CM | POA: Diagnosis not present

## 2016-01-29 DIAGNOSIS — I429 Cardiomyopathy, unspecified: Secondary | ICD-10-CM | POA: Diagnosis not present

## 2016-01-29 DIAGNOSIS — H21569 Pupillary abnormality, unspecified eye: Secondary | ICD-10-CM | POA: Insufficient documentation

## 2016-01-29 DIAGNOSIS — I428 Other cardiomyopathies: Secondary | ICD-10-CM | POA: Insufficient documentation

## 2016-01-29 HISTORY — PX: CARDIAC CATHETERIZATION: SHX172

## 2016-01-29 LAB — POCT I-STAT 3, ART BLOOD GAS (G3+)
Acid-base deficit: 7 mmol/L — ABNORMAL HIGH (ref 0.0–2.0)
Bicarbonate: 18.5 mEq/L — ABNORMAL LOW (ref 20.0–24.0)
O2 Saturation: 93 %
TCO2: 20 mmol/L (ref 0–100)
pCO2 arterial: 37.8 mmHg (ref 35.0–45.0)
pH, Arterial: 7.298 — ABNORMAL LOW (ref 7.350–7.450)
pO2, Arterial: 73 mmHg — ABNORMAL LOW (ref 80.0–100.0)

## 2016-01-29 LAB — CBC
HCT: 48.5 % (ref 39.0–52.0)
Hemoglobin: 17.1 g/dL — ABNORMAL HIGH (ref 13.0–17.0)
MCH: 30.1 pg (ref 26.0–34.0)
MCHC: 35.3 g/dL (ref 30.0–36.0)
MCV: 85.4 fL (ref 78.0–100.0)
Platelets: 132 10*3/uL — ABNORMAL LOW (ref 150–400)
RBC: 5.68 MIL/uL (ref 4.22–5.81)
RDW: 14.5 % (ref 11.5–15.5)
WBC: 5.1 10*3/uL (ref 4.0–10.5)

## 2016-01-29 LAB — POCT I-STAT 3, VENOUS BLOOD GAS (G3P V)
Acid-base deficit: 3 mmol/L — ABNORMAL HIGH (ref 0.0–2.0)
Bicarbonate: 23 mEq/L (ref 20.0–24.0)
O2 Saturation: 66 %
TCO2: 24 mmol/L (ref 0–100)
pCO2, Ven: 41.7 mmHg — ABNORMAL LOW (ref 45.0–50.0)
pH, Ven: 7.35 — ABNORMAL HIGH (ref 7.250–7.300)
pO2, Ven: 36 mmHg (ref 31.0–45.0)

## 2016-01-29 LAB — PROTIME-INR
INR: 1.07 (ref 0.00–1.49)
Prothrombin Time: 14.1 seconds (ref 11.6–15.2)

## 2016-01-29 LAB — CREATININE, SERUM
Creatinine, Ser: 1.01 mg/dL (ref 0.61–1.24)
GFR calc Af Amer: >60 mL/min (ref >60)
GFR calc non Af Amer: >60 mL/min (ref >60)

## 2016-01-29 LAB — GLUCOSE, CAPILLARY: Glucose-Capillary: 98 mg/dL (ref 65–99)

## 2016-01-29 LAB — HEMOGLOBIN A1C
Hgb A1c MFr Bld: 5.6 % (ref 4.8–5.6)
Mean Plasma Glucose: 114 mg/dL

## 2016-01-29 LAB — POCT ACTIVATED CLOTTING TIME: Activated Clotting Time: 183 seconds

## 2016-01-29 SURGERY — RIGHT/LEFT HEART CATH AND CORONARY ANGIOGRAPHY
Anesthesia: LOCAL

## 2016-01-29 MED ORDER — SODIUM CHLORIDE 0.9% FLUSH
3.0000 mL | Freq: Two times a day (BID) | INTRAVENOUS | Status: DC
  Administered 2016-01-30: 3 mL via INTRAVENOUS

## 2016-01-29 MED ORDER — HEPARIN SODIUM (PORCINE) 1000 UNIT/ML IJ SOLN
INTRAMUSCULAR | Status: DC | PRN
  Administered 2016-01-29: 5000 [IU] via INTRAVENOUS

## 2016-01-29 MED ORDER — SODIUM CHLORIDE 0.9% FLUSH: 3.0000 mL | Freq: Two times a day (BID) | INTRAVENOUS | Status: DC

## 2016-01-29 MED ORDER — ONDANSETRON HCL 4 MG/2ML IJ SOLN: 4.0000 mg | Freq: Four times a day (QID) | INTRAMUSCULAR | Status: DC | PRN

## 2016-01-29 MED ORDER — SODIUM CHLORIDE 0.9 % IV SOLN: 250.0000 mL | INTRAVENOUS | Status: DC | PRN

## 2016-01-29 MED ORDER — SODIUM CHLORIDE 0.9% FLUSH
3.0000 mL | Freq: Two times a day (BID) | INTRAVENOUS | Status: DC
  Administered 2016-01-29 – 2016-01-30 (×2): 3 mL via INTRAVENOUS

## 2016-01-29 MED ORDER — SODIUM CHLORIDE 0.9 % IV SOLN
INTRAVENOUS | Status: AC
Start: 2016-01-29 — End: 2016-01-30

## 2016-01-29 MED ORDER — DIAZEPAM 5 MG PO TABS: 5.0000 mg | ORAL_TABLET | Freq: Four times a day (QID) | ORAL | Status: DC | PRN

## 2016-01-29 MED ORDER — VERAPAMIL HCL 2.5 MG/ML IV SOLN
INTRAVENOUS | Status: AC
  Filled 2016-01-29: qty 2

## 2016-01-29 MED ORDER — ACETAMINOPHEN 325 MG PO TABS: 650.0000 mg | ORAL_TABLET | ORAL | Status: DC | PRN

## 2016-01-29 MED ORDER — IOPAMIDOL (ISOVUE-370) INJECTION 76%
INTRAVENOUS | Status: DC | PRN
  Administered 2016-01-29: 60 mL

## 2016-01-29 MED ORDER — ENOXAPARIN SODIUM 40 MG/0.4ML ~~LOC~~ SOLN: 40.0000 mg | SUBCUTANEOUS | Status: DC

## 2016-01-29 MED ORDER — MIDAZOLAM HCL 2 MG/2ML IJ SOLN
INTRAMUSCULAR | Status: AC
  Filled 2016-01-29: qty 2

## 2016-01-29 MED ORDER — VERAPAMIL HCL 2.5 MG/ML IV SOLN
INTRA_ARTERIAL | Status: DC | PRN
  Administered 2016-01-29: 15 mL via INTRA_ARTERIAL

## 2016-01-29 MED ORDER — LIDOCAINE HCL (PF) 1 % IJ SOLN
INTRAMUSCULAR | Status: AC
  Filled 2016-01-29: qty 30

## 2016-01-29 MED ORDER — FENTANYL CITRATE (PF) 100 MCG/2ML IJ SOLN
INTRAMUSCULAR | Status: AC
  Filled 2016-01-29: qty 2

## 2016-01-29 MED ORDER — HEPARIN (PORCINE) IN NACL 2-0.9 UNIT/ML-% IJ SOLN: INTRAMUSCULAR | Status: DC | PRN

## 2016-01-29 MED ORDER — CARVEDILOL 12.5 MG PO TABS
12.5000 mg | ORAL_TABLET | Freq: Two times a day (BID) | ORAL | Status: DC
  Administered 2016-01-29 – 2016-01-30 (×3): 12.5 mg via ORAL
  Filled 2016-01-29 (×3): qty 1

## 2016-01-29 MED ORDER — SODIUM CHLORIDE 0.9% FLUSH: 3.0000 mL | INTRAVENOUS | Status: DC | PRN

## 2016-01-29 MED ORDER — LIDOCAINE HCL (PF) 1 % IJ SOLN
INTRAMUSCULAR | Status: DC | PRN
  Administered 2016-01-29: 4 mL
  Administered 2016-01-29: 2 mL

## 2016-01-29 MED ORDER — IOPAMIDOL (ISOVUE-370) INJECTION 76%
INTRAVENOUS | Status: AC
  Filled 2016-01-29: qty 100

## 2016-01-29 MED ORDER — SODIUM CHLORIDE 0.9 % IV SOLN: INTRAVENOUS | Status: AC

## 2016-01-29 MED ORDER — HEPARIN (PORCINE) IN NACL 2-0.9 UNIT/ML-% IJ SOLN
INTRAMUSCULAR | Status: DC | PRN
  Administered 2016-01-29: 1000 mL

## 2016-01-29 MED ORDER — HEPARIN SODIUM (PORCINE) 1000 UNIT/ML IJ SOLN
INTRAMUSCULAR | Status: AC
  Filled 2016-01-29: qty 1

## 2016-01-29 MED ORDER — NITROGLYCERIN 1 MG/10 ML FOR IR/CATH LAB
INTRA_ARTERIAL | Status: AC
  Filled 2016-01-29: qty 10

## 2016-01-29 MED ORDER — HEPARIN (PORCINE) IN NACL 2-0.9 UNIT/ML-% IJ SOLN
INTRAMUSCULAR | Status: AC
  Filled 2016-01-29: qty 1000

## 2016-01-29 MED ORDER — FENTANYL CITRATE (PF) 100 MCG/2ML IJ SOLN
INTRAMUSCULAR | Status: DC | PRN
  Administered 2016-01-29: 50 ug via INTRAVENOUS

## 2016-01-29 MED ORDER — MIDAZOLAM HCL 2 MG/2ML IJ SOLN
INTRAMUSCULAR | Status: DC | PRN
Start: 2016-01-29 — End: 2016-01-29
  Administered 2016-01-29: 2 mg via INTRAVENOUS

## 2016-01-29 MED ORDER — SODIUM CHLORIDE 0.9 % IV SOLN: INTRAVENOUS | Status: DC

## 2016-01-29 SURGICAL SUPPLY — 15 items
CATH BALLN WEDGE 5F 110CM (CATHETERS) ×2 IMPLANT
CATH INFINITI 5FR ANG PIGTAIL (CATHETERS) ×2 IMPLANT
CATH OPTITORQUE TIG 4.0 5F (CATHETERS) ×2 IMPLANT
DEVICE RAD COMP TR BAND LRG (VASCULAR PRODUCTS) ×2 IMPLANT
GLIDESHEATH SLEND SS 6F .021 (SHEATH) ×2 IMPLANT
GUIDEWIRE .025 260CM (WIRE) ×2 IMPLANT
KIT HEART LEFT (KITS) ×2 IMPLANT
PACK CARDIAC CATHETERIZATION (CUSTOM PROCEDURE TRAY) ×2 IMPLANT
SHEATH FAST CATH BRACH 5F 5CM (SHEATH) ×2 IMPLANT
SYR MEDRAD MARK V 150ML (SYRINGE) ×2 IMPLANT
TRANSDUCER W/STOPCOCK (MISCELLANEOUS) ×6 IMPLANT
TUBING ART PRESS 72  MALE/FEM (TUBING) ×1
TUBING ART PRESS 72 MALE/FEM (TUBING) ×1 IMPLANT
TUBING CIL FLEX 10 FLL-RA (TUBING) ×2 IMPLANT
WIRE SAFE-T 1.5MM-J .035X260CM (WIRE) ×2 IMPLANT

## 2016-01-29 NOTE — Interval H&P Note (Signed)
Cath Lab Visit (complete for each Cath Lab visit)  Clinical Evaluation Leading to the Procedure:   ACS: No.  Non-ACS:    Anginal Classification: CCS III  Anti-ischemic medical therapy: Maximal Therapy (2 or more classes of medications)  Non-Invasive Test Results: No non-invasive testing performed  Prior CABG: No previous CABG      History and Physical Interval Note:  01/29/2016 3:40 PM  John Cain  has presented today for surgery, with the diagnosis of Lw EF   The various methods of treatment have been discussed with the patient and family. After consideration of risks, benefits and other options for treatment, the patient has consented to  Procedure(s): Right/Left Heart Cath and Coronary Angiography (N/A) as a surgical intervention .  The patient's history has been reviewed, patient examined, no change in status, stable for surgery.  I have reviewed the patient's chart and labs.  Questions were answered to the patient's satisfaction.     Alianah Lofton A

## 2016-01-29 NOTE — H&P (View-Only) (Signed)
DAILY PROGRESS NOTE  Subjective:  Appreciate neurology evaluation. Head MRI was reassuring - outpatient opthalmology evaluation is recommended. BP is much improved on current regimen. No further chest pain. Echo yesterday shows markedly reduced LV systolic function with EF 20-25%, suspect hypertensive cardiomyopathy.  LDL noted to be 115 - would not initiate treatment, unless shown to have CAD on cath.  Objective:   Temp:  [98.1 F (36.7 C)-98.8 F (37.1 C)] 98.2 F (36.8 C) (04/13 0800) Pulse Rate:  [48-114] 87 (04/13 0900) Resp:  [8-34] 24 (04/13 0900) BP: (82-168)/(51-121) 112/86 mmHg (04/13 0900) SpO2:  [92 %-100 %] 96 % (04/13 0900) Weight change:   Intake/Output from previous day: 04/12 0701 - 04/13 0700 In: 518.3 [P.O.:480; I.V.:38.3] Out: 1200 [Urine:1200]  Intake/Output from this shift: Total I/O In: -  Out: 200 [Urine:200]  Medications: Current Facility-Administered Medications  Medication Dose Route Frequency Provider Last Rate Last Dose  . acetaminophen (TYLENOL) tablet 650 mg  650 mg Oral Q4H PRN Alphia Moh, MD      . amLODipine (NORVASC) tablet 10 mg  10 mg Oral Daily Alphia Moh, MD   10 mg at 01/28/16 0815  . aspirin EC tablet 81 mg  81 mg Oral Daily Alphia Moh, MD   81 mg at 01/28/16 0817  . chlorthalidone (HYGROTON) tablet 25 mg  25 mg Oral Daily Pixie Casino, MD   25 mg at 01/28/16 1248  . diphenhydrAMINE (BENADRYL) capsule 25 mg  25 mg Oral QHS PRN Wandra Mannan, MD   25 mg at 01/28/16 2258  . enoxaparin (LOVENOX) injection 40 mg  40 mg Subcutaneous Q24H Alphia Moh, MD   40 mg at 01/29/16 0816  . hydrALAZINE (APRESOLINE) injection 10 mg  10 mg Intravenous Q6H PRN Pixie Casino, MD   10 mg at 01/28/16 1745  . hydrochlorothiazide (HYDRODIURIL) tablet 25 mg  25 mg Oral Daily Alphia Moh, MD   25 mg at 01/28/16 0817  . labetalol (NORMODYNE) tablet 200 mg  200 mg Oral BID Pixie Casino, MD   200 mg at 01/28/16 2258  .  labetalol (NORMODYNE,TRANDATE) injection 10 mg  10 mg Intravenous Q2H PRN Alphia Moh, MD   10 mg at 01/27/16 2355  . ondansetron (ZOFRAN) injection 4 mg  4 mg Intravenous Q6H PRN Alphia Moh, MD        Physical Exam: General appearance: alert and no distress Neck: no carotid bruit, no JVD and there is mild left exotropia Lungs: clear to auscultation bilaterally Heart: regular rate and rhythm, S1, S2 normal and S3 present Abdomen: soft, non-tender; bowel sounds normal; no masses,  no organomegaly Extremities: extremities normal, atraumatic, no cyanosis or edema Pulses: 2+ and symmetric Skin: Skin color, texture, turgor normal. No rashes or lesions Neurologic: Mental status: Alert, oriented, thought content appropriate Cranial nerves: II-XII intact, except VI  Lab Results: Results for orders placed or performed during the hospital encounter of 01/27/16 (from the past 48 hour(s))  Basic metabolic panel     Status: Abnormal   Collection Time: 01/27/16  2:34 PM  Result Value Ref Range   Sodium 139 135 - 145 mmol/L   Potassium 4.0 3.5 - 5.1 mmol/L   Chloride 109 101 - 111 mmol/L   CO2 22 22 - 32 mmol/L   Glucose, Bld 118 (H) 65 - 99 mg/dL   BUN 11 6 - 20 mg/dL   Creatinine, Ser 1.05 0.61 - 1.24 mg/dL   Calcium 8.6 (L) 8.9 - 10.3  mg/dL   GFR calc non Af Amer >60 >60 mL/min   GFR calc Af Amer >60 >60 mL/min    Comment: (NOTE) The eGFR has been calculated using the CKD EPI equation. This calculation has not been validated in all clinical situations. eGFR's persistently <60 mL/min signify possible Chronic Kidney Disease.    Anion gap 8 5 - 15  CBC     Status: Abnormal   Collection Time: 01/27/16  2:34 PM  Result Value Ref Range   WBC 7.8 4.0 - 10.5 K/uL   RBC 5.93 (H) 4.22 - 5.81 MIL/uL   Hemoglobin 18.0 (H) 13.0 - 17.0 g/dL   HCT 50.4 39.0 - 52.0 %   MCV 85.0 78.0 - 100.0 fL   MCH 30.4 26.0 - 34.0 pg   MCHC 35.7 30.0 - 36.0 g/dL   RDW 14.2 11.5 - 15.5 %   Platelets  108 (L) 150 - 400 K/uL    Comment: SPECIMEN CHECKED FOR CLOTS PLATELET COUNT CONFIRMED BY SMEAR   Troponin I     Status: None   Collection Time: 01/27/16  2:34 PM  Result Value Ref Range   Troponin I <0.03 <0.031 ng/mL    Comment:        NO INDICATION OF MYOCARDIAL INJURY.   I-stat troponin, ED     Status: None   Collection Time: 01/27/16  2:50 PM  Result Value Ref Range   Troponin i, poc 0.02 0.00 - 0.08 ng/mL   Comment 3            Comment: Due to the release kinetics of cTnI, a negative result within the first hours of the onset of symptoms does not rule out myocardial infarction with certainty. If myocardial infarction is still suspected, repeat the test at appropriate intervals.   Brain natriuretic peptide     Status: Abnormal   Collection Time: 01/27/16  2:51 PM  Result Value Ref Range   B Natriuretic Peptide 205.0 (H) 0.0 - 100.0 pg/mL  Urine rapid drug screen (hosp performed)     Status: None   Collection Time: 01/27/16  3:34 PM  Result Value Ref Range   Opiates NONE DETECTED NONE DETECTED   Cocaine NONE DETECTED NONE DETECTED   Benzodiazepines NONE DETECTED NONE DETECTED   Amphetamines NONE DETECTED NONE DETECTED   Tetrahydrocannabinol NONE DETECTED NONE DETECTED   Barbiturates NONE DETECTED NONE DETECTED    Comment:        DRUG SCREEN FOR MEDICAL PURPOSES ONLY.  IF CONFIRMATION IS NEEDED FOR ANY PURPOSE, NOTIFY LAB WITHIN 5 DAYS.        LOWEST DETECTABLE LIMITS FOR URINE DRUG SCREEN Drug Class       Cutoff (ng/mL) Amphetamine      1000 Barbiturate      200 Benzodiazepine   034 Tricyclics       917 Opiates          300 Cocaine          300 THC              50   I-stat troponin, ED     Status: None   Collection Time: 01/27/16  6:33 PM  Result Value Ref Range   Troponin i, poc 0.04 0.00 - 0.08 ng/mL   Comment 3            Comment: Due to the release kinetics of cTnI, a negative result within the first hours of the onset of symptoms does not rule  out myocardial infarction with certainty. If myocardial infarction is still suspected, repeat the test at appropriate intervals.   MRSA PCR Screening     Status: None   Collection Time: 01/27/16  9:41 PM  Result Value Ref Range   MRSA by PCR NEGATIVE NEGATIVE    Comment:        The GeneXpert MRSA Assay (FDA approved for NASAL specimens only), is one component of a comprehensive MRSA colonization surveillance program. It is not intended to diagnose MRSA infection nor to guide or monitor treatment for MRSA infections.   CBC     Status: Abnormal   Collection Time: 01/28/16  9:46 AM  Result Value Ref Range   WBC 8.4 4.0 - 10.5 K/uL   RBC 5.17 4.22 - 5.81 MIL/uL   Hemoglobin 15.6 13.0 - 17.0 g/dL   HCT 44.0 39.0 - 52.0 %   MCV 85.1 78.0 - 100.0 fL   MCH 30.2 26.0 - 34.0 pg   MCHC 35.5 30.0 - 36.0 g/dL   RDW 14.3 11.5 - 15.5 %   Platelets 136 (L) 150 - 400 K/uL  TSH     Status: None   Collection Time: 01/28/16  9:46 AM  Result Value Ref Range   TSH 1.838 0.350 - 4.500 uIU/mL  Troponin I     Status: None   Collection Time: 01/28/16  9:46 AM  Result Value Ref Range   Troponin I <0.03 <0.031 ng/mL    Comment:        NO INDICATION OF MYOCARDIAL INJURY.   Hemoglobin A1c     Status: None   Collection Time: 01/28/16  9:46 AM  Result Value Ref Range   Hgb A1c MFr Bld 5.6 4.8 - 5.6 %    Comment: (NOTE)         Pre-diabetes: 5.7 - 6.4         Diabetes: >6.4         Glycemic control for adults with diabetes: <7.0    Mean Plasma Glucose 114 mg/dL    Comment: (NOTE) Performed At: [facility] [place], [place] [id number] Lindon Romp MD GS:[id number]   Lipid panel     Status: Abnormal   Collection Time: 01/28/16  9:46 AM  Result Value Ref Range   Cholesterol 211 (H) 0 - 200 mg/dL   Triglycerides 86 <150 mg/dL   HDL 79 >40 mg/dL   Total CHOL/HDL Ratio 2.7 RATIO   VLDL 17 0 - 40 mg/dL   LDL Cholesterol 115 (H) 0 - 99 mg/dL    Comment:         Total Cholesterol/HDL:CHD Risk Coronary Heart Disease Risk Table                     Men   Women  1/2 Average Risk   3.4   3.3  Average Risk       5.0   4.4  2 X Average Risk   9.6   7.1  3 X Average Risk  23.4   11.0        Use the calculated Patient Ratio above and the CHD Risk Table to determine the patient's CHD Risk.        ATP III CLASSIFICATION (LDL):  <100     mg/dL   Optimal  100-129  mg/dL   Near or Above  Optimal  130-159  mg/dL   Borderline  160-189  mg/dL   High  >190     mg/dL   Very High   Basic metabolic panel     Status: Abnormal   Collection Time: 01/28/16  9:46 AM  Result Value Ref Range   Sodium 142 135 - 145 mmol/L   Potassium 3.7 3.5 - 5.1 mmol/L   Chloride 106 101 - 111 mmol/L   CO2 25 22 - 32 mmol/L   Glucose, Bld 137 (H) 65 - 99 mg/dL   BUN 10 6 - 20 mg/dL   Creatinine, Ser 1.19 0.61 - 1.24 mg/dL   Calcium 9.3 8.9 - 10.3 mg/dL   GFR calc non Af Amer >60 >60 mL/min   GFR calc Af Amer >60 >60 mL/min    Comment: (NOTE) The eGFR has been calculated using the CKD EPI equation. This calculation has not been validated in all clinical situations. eGFR's persistently <60 mL/min signify possible Chronic Kidney Disease.    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 Hypertension. Sickle cell trait. EXAM: CT ANGIOGRAPHY HEAD AND NECK TECHNIQUE: Multidetector CT imaging of the head and neck was performed using the standard protocol during bolus administration of intravenous contrast. Multiplanar CT image reconstructions and MIPs were obtained to evaluate the vascular anatomy. Carotid stenosis measurements (when applicable) are obtained utilizing NASCET criteria, using the distal internal carotid diameter as  the denominator. CONTRAST:  100 mL Isovue 370 IV. 2 injections of 50 mL contrast were performed. The first attempt had suboptimal arterial contrast due to late timing of the scan. New IV was started the right arm and a repeat study was performed which is diagnostic. COMPARISON:  CT head 01/19/2015 FINDINGS: CT HEAD Brain: Ventricle size normal. Negative for acute or chronic infarction. Negative for intracranial hemorrhage or mass. Calvarium and skull base: Negative Paranasal sinuses: Negative Orbits: Negative CTA NECK Aortic arch: Normal aortic arch. No atherosclerotic disease or calcification. Proximal great vessels widely patent. Right carotid system: Normal right carotid. No evidence of atherosclerotic disease or dissection. No significant stenosis Left carotid system: Normal left carotid. Negative for atherosclerotic disease or dissection. No significant stenosis Vertebral arteries:Normal vertebral arteries bilaterally without stenosis or dissection. Skeleton: Multiple dental caries and periapical lucencies around teeth. This is most prominent right lower third molar due to infection. Other neck: Negative for mass or adenopathy in the neck. Mild apical emphysema with subpleural cysts bilaterally. CTA HEAD Anterior circulation: The right cavernous carotid artery is widely patent without stenosis. Right posterior communicating artery patent. Right anterior and middle cerebral arteries widely patent without stenosis. Left cavernous carotid artery widely patent. Left posterior communicating artery patent. No aneurysm. Left anterior and middle cerebral arteries widely patent. Posterior circulation: Both vertebral arteries patent to the basilar. PICA patent bilaterally. Basilar widely patent. Superior cerebellar and posterior cerebral arteries patent bilaterally. Negative for aneurysm in the posterior circulation Venous sinuses: Patent Anatomic variants: None Delayed phase: Normal enhancement on delayed imaging  IMPRESSION: Normal CTA of the head and neck. No evidence of aneurysm. No dissection or stenosis. Electronically Signed   By: Franchot Gallo M.D.   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 Hypertension. Sickle cell trait. EXAM: CT ANGIOGRAPHY HEAD AND NECK TECHNIQUE: Multidetector CT imaging of the head and neck was performed using the standard protocol during bolus administration of intravenous contrast. Multiplanar CT image reconstructions and MIPs were obtained to evaluate the vascular anatomy. Carotid stenosis measurements (when applicable) are obtained utilizing NASCET criteria, using the distal internal carotid diameter as the denominator. CONTRAST:  100 mL Isovue 370 IV. 2 injections of 50 mL contrast were performed. The first attempt had suboptimal arterial contrast due to late timing of the scan. New IV was started the right arm and a repeat study was performed which is diagnostic. COMPARISON:  CT head 01/19/2015 FINDINGS: CT HEAD Brain: Ventricle size normal. Negative for acute or chronic infarction. Negative for intracranial hemorrhage or mass. Calvarium and skull base: Negative Paranasal sinuses: Negative Orbits: Negative CTA NECK Aortic arch: Normal aortic arch. No atherosclerotic disease or calcification. Proximal great vessels widely patent. Right carotid system: Normal right carotid. No evidence of atherosclerotic disease or dissection. No significant stenosis Left carotid system: Normal left carotid. Negative for atherosclerotic disease or dissection. No significant stenosis Vertebral arteries:Normal vertebral arteries bilaterally without stenosis or dissection. Skeleton: Multiple dental caries and periapical lucencies around teeth. This is most prominent right lower third molar due to infection. Other neck: Negative for mass or adenopathy in the neck. Mild apical emphysema with subpleural cysts bilaterally. CTA HEAD Anterior  circulation: The right cavernous carotid artery is widely patent without stenosis. Right posterior communicating artery patent. Right anterior and middle cerebral arteries widely patent without stenosis. Left cavernous carotid artery widely patent. Left posterior communicating artery patent. No aneurysm. Left anterior and middle cerebral arteries widely patent. Posterior circulation: Both vertebral arteries patent to the basilar. PICA patent bilaterally. Basilar widely patent. Superior cerebellar and posterior cerebral arteries patent bilaterally. Negative for aneurysm in the posterior circulation Venous sinuses: Patent Anatomic variants: None Delayed phase: Normal enhancement on delayed imaging IMPRESSION: Normal CTA of the head and neck. No evidence of aneurysm. No dissection or stenosis. Electronically Signed   By: Franchot Gallo M.D.   On: 01/28/2016 12:46   Ct Angio Chest Pe W/cm &/or Wo Cm  01/27/2016  CLINICAL DATA:  Chest pain and shortness of Breath EXAM: CT ANGIOGRAPHY CHEST WITH CONTRAST TECHNIQUE: Multidetector CT imaging of the chest was performed using the standard protocol during bolus administration of intravenous contrast. Multiplanar CT image reconstructions and MIPs were obtained to evaluate the vascular anatomy. CONTRAST:  100 mL Isovue 370. COMPARISON:  None. FINDINGS: Lungs are well aerated bilaterally. No focal infiltrate or sizable effusion is seen. Diffuse emphysematous changes are seen. The thoracic inlet is within normal limits. The thoracic aorta shows no aneurysmal dilatation or dissection. The pulmonary artery shows a normal branching pattern without focal filling defect to suggest pulmonary embolism. No significant hilar or mediastinal adenopathy is noted. A few small aortic or pulmonary window nodes are seen. Scanning into the upper abdomen shows no acute abnormality. The bony structures show no acute abnormality. Review of the MIP images confirms the above findings. IMPRESSION: No  evidence of pulmonary emboli. Diffuse emphysematous changes. Electronically Signed   By: Inez Catalina M.D.   On: 01/27/2016 17:53   Mr Jeri Cos KG Contrast  01/28/2016  CLINICAL DATA:  Third nerve palsy. Left eye vision change. History of neurofibromatosis and sickle cell trait EXAM: MRI HEAD AND ORBITS WITHOUT AND WITH CONTRAST  TECHNIQUE: Multiplanar, multiecho pulse sequences of the brain and surrounding structures were obtained without and with intravenous contrast. Multiplanar, multiecho pulse sequences of the orbits and surrounding structures were obtained including fat saturation techniques, before and after intravenous contrast administration. CONTRAST:  37m MULTIHANCE GADOBENATE DIMEGLUMINE 529 MG/ML IV SOLN COMPARISON:  CT head 01/28/2016 FINDINGS: MRI HEAD FINDINGS Ventricle size normal.  Cerebral volume normal. Negative for acute or chronic infarction. Negative for demyelinating disease.  Cerebral white matter normal. Negative for intracranial hemorrhage.  Negative for mass or edema. Postcontrast imaging reveals normal enhancement of the brain and surrounding coverings. No enhancing mass. Leptomeningeal enhancement normal. Pituitary normal in size. Normal skullbase. Paranasal sinuses clear. MRI ORBITS FINDINGS Normal shape of the globe. Optic nerve normal in size and signal bilaterally. No enhancing mass in the optic nerve. Extraocular muscles are normal. Orbital fat is normal without infiltration or mass. Serpiginous tubular structures are present in the superior orbit bilaterally. These show enhancement postcontrast. These most likely are dilated veins. Neurofibromas considered less likely given their elongated tubular serpiginous shape. No evidence of cavernous sinus thrombosis or cavernous carotid fistula. The cavernous sinus enhances normally and symmetrically without enlargement or filling defect. No enhancing mass lesion in the orbit. IMPRESSION: Negative MRI of the brain with contrast. No  evidence of infarct or mass Serpiginous tubular structures in the superior orbit bilaterally most compatible with dilated veins . 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 No evidence of cavernous sinus thrombosis or cavernous carotid fistula. Electronically Signed   By: CFranchot GalloM.D.   On: 01/28/2016 16:15   Dg Chest Port 1 View  01/27/2016  CLINICAL DATA:  Chest pain EXAM: PORTABLE CHEST 1 VIEW COMPARISON:  None. FINDINGS: Cardiac shadow is within normal limits. The lungs are well aerated bilaterally. Increased density is noted in the medial right lung base which may represent some early infiltrate. No acute bony abnormality is noted. IMPRESSION: Likely early infiltrate in the medial right lung base. Electronically Signed   By: MInez CatalinaM.D.   On: 01/27/2016 15:49   Mr ODarnelle CatalanWo/w Cm  01/28/2016  CLINICAL DATA:  Third nerve palsy. Left eye vision change. History of neurofibromatosis and sickle cell trait EXAM: MRI HEAD AND ORBITS WITHOUT AND WITH CONTRAST TECHNIQUE: Multiplanar, multiecho pulse sequences of the brain and surrounding structures were obtained without and with intravenous contrast. Multiplanar, multiecho pulse sequences of the orbits and surrounding structures were obtained including fat saturation techniques, before and after intravenous contrast administration. CONTRAST:  25mMULTIHANCE GADOBENATE DIMEGLUMINE 529 MG/ML IV SOLN COMPARISON:  CT head 01/28/2016 FINDINGS: MRI HEAD FINDINGS Ventricle size normal.  Cerebral volume normal. Negative for acute or chronic infarction. Negative for demyelinating disease.  Cerebral white matter normal. Negative for intracranial hemorrhage.  Negative for mass or edema. Postcontrast imaging reveals normal enhancement of the brain and surrounding coverings. No enhancing mass. Leptomeningeal enhancement normal. Pituitary normal in size. Normal skullbase. Paranasal sinuses clear. MRI ORBITS FINDINGS Normal shape of the globe. Optic nerve normal in size and  signal bilaterally. No enhancing mass in the optic nerve. Extraocular muscles are normal. Orbital fat is normal without infiltration or mass. Serpiginous tubular structures are present in the superior orbit bilaterally. These show enhancement postcontrast. These most likely are dilated veins. Neurofibromas considered less likely given their elongated tubular serpiginous shape. No evidence of cavernous sinus thrombosis or cavernous carotid fistula. The cavernous sinus enhances normally and symmetrically without enlargement or filling defect. No enhancing mass lesion in the orbit. IMPRESSION: Negative  MRI of the brain with contrast. No evidence of infarct or mass Serpiginous tubular structures in the superior orbit bilaterally most compatible with dilated veins . 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 No evidence of cavernous sinus thrombosis or cavernous carotid fistula. Electronically Signed   By: Franchot Gallo M.D.   On: 01/28/2016 16:15    Assessment:  Principal Problem:   Hypertensive emergency Active Problems:   Chest pain   Hypertensive urgency   Exotropia of left eye   Cardiomyopathy ([place])  Plan:  1.  John Cain has a newly diagnosed cardiomyopathy by echo. His blood pressure is now better controlled, but in light of the new findings, will have to re-adjust BP meds for CHF benefit. Given chest pain, I would favor definitive L/RHC to assess for CAD. Although it is less likely given age, would not want to miss an opportunity to possibly improve LV function. Discussed risks/benefits/alternatives to [facility] today and he is willing to proceed. Keep NPO for cath this afternoon. Switch labetalol to carvedilol. Continue amlodipine/chlorthalidone for now. Will assess SVR on RHC today along with filling pressures. Would start ARB after cath tomorrow. Continue low dose aspirin.  Time Spent Directly with Patient:  15 minutes   Length of Stay:  LOS: 2 days   Pixie Casino, MD, [facility] Attending  Cardiologist [place] 01/29/2016, 10:46 AM

## 2016-01-29 NOTE — Progress Notes (Signed)
Subjective: No significant changes  Exam: Filed Vitals:   01/29/16 1058 01/29/16 1100  BP:  131/97  Pulse:  88  Temp: 97.9 F (36.6 C)   Resp:  20   Gen: In bed, NAD Resp: non-labored breathing, no acute distress Abd: soft, nt  Neuro: MS: Awake, alert, interactive and appropriate CN: Marked afferent pupillary defect on the left, with diet slightly outwardly deviated and down compared to the right  Impression:35 year old male with decreased vision in the left eye. I was thinking that it was likely a partial third nerve palsy, however it could just be an exotropia unmasked by his lack of vision in that eye. With no findings on CTA or MRI to explain his findings, I think that the next step will be follow-up with an ophthalmologist.  Recommendations:  Ophthalmology referral.  Please call with any further questions or concerns.  neurology will sign off.  Ritta Slot, MD [facility] [phone]  If 7pm- 7am, please page neurology on call as listed in AMION.

## 2016-01-29 NOTE — Progress Notes (Addendum)
DAILY PROGRESS NOTE  Subjective:  Appreciate neurology evaluation. Head MRI was reassuring - outpatient opthalmology evaluation is recommended. BP is much improved on current regimen. No further chest pain. Echo yesterday shows markedly reduced LV systolic function with EF 20-25%, suspect hypertensive cardiomyopathy.  LDL noted to be 115 - would not initiate treatment, unless shown to have CAD on cath.  Objective:   Temp:  [98.1 F (36.7 C)-98.8 F (37.1 C)] 98.2 F (36.8 C) (04/13 0800) Pulse Rate:  [48-114] 87 (04/13 0900) Resp:  [8-34] 24 (04/13 0900) BP: (82-168)/(51-121) 112/86 mmHg (04/13 0900) SpO2:  [92 %-100 %] 96 % (04/13 0900) Weight change:   Intake/Output from previous day: 04/12 0701 - 04/13 0700 In: 518.3 [P.O.:480; I.V.:38.3] Out: 1200 [Urine:1200]  Intake/Output from this shift: Total I/O In: -  Out: 200 [Urine:200]  Medications: Current Facility-Administered Medications  Medication Dose Route Frequency Provider Last Rate Last Dose  . acetaminophen (TYLENOL) tablet 650 mg  650 mg Oral Q4H PRN Alphia Moh, MD      . amLODipine (NORVASC) tablet 10 mg  10 mg Oral Daily Alphia Moh, MD   10 mg at 01/28/16 0815  . aspirin EC tablet 81 mg  81 mg Oral Daily Alphia Moh, MD   81 mg at 01/28/16 0817  . chlorthalidone (HYGROTON) tablet 25 mg  25 mg Oral Daily Pixie Casino, MD   25 mg at 01/28/16 1248  . diphenhydrAMINE (BENADRYL) capsule 25 mg  25 mg Oral QHS PRN Wandra Mannan, MD   25 mg at 01/28/16 2258  . enoxaparin (LOVENOX) injection 40 mg  40 mg Subcutaneous Q24H Alphia Moh, MD   40 mg at 01/29/16 0816  . hydrALAZINE (APRESOLINE) injection 10 mg  10 mg Intravenous Q6H PRN Pixie Casino, MD   10 mg at 01/28/16 1745  . hydrochlorothiazide (HYDRODIURIL) tablet 25 mg  25 mg Oral Daily Alphia Moh, MD   25 mg at 01/28/16 0817  . labetalol (NORMODYNE) tablet 200 mg  200 mg Oral BID Pixie Casino, MD   200 mg at 01/28/16 2258  .  labetalol (NORMODYNE,TRANDATE) injection 10 mg  10 mg Intravenous Q2H PRN Alphia Moh, MD   10 mg at 01/27/16 2355  . ondansetron (ZOFRAN) injection 4 mg  4 mg Intravenous Q6H PRN Alphia Moh, MD        Physical Exam: General appearance: alert and no distress Neck: no carotid bruit, no JVD and there is mild left exotropia Lungs: clear to auscultation bilaterally Heart: regular rate and rhythm, S1, S2 normal and S3 present Abdomen: soft, non-tender; bowel sounds normal; no masses,  no organomegaly Extremities: extremities normal, atraumatic, no cyanosis or edema Pulses: 2+ and symmetric Skin: Skin color, texture, turgor normal. No rashes or lesions Neurologic: Mental status: Alert, oriented, thought content appropriate Cranial nerves: II-XII intact, except VI  Lab Results: Results for orders placed or performed during the hospital encounter of 01/27/16 (from the past 48 hour(s))  Basic metabolic panel     Status: Abnormal   Collection Time: 01/27/16  2:34 PM  Result Value Ref Range   Sodium 139 135 - 145 mmol/L   Potassium 4.0 3.5 - 5.1 mmol/L   Chloride 109 101 - 111 mmol/L   CO2 22 22 - 32 mmol/L   Glucose, Bld 118 (H) 65 - 99 mg/dL   BUN 11 6 - 20 mg/dL   Creatinine, Ser 1.05 0.61 - 1.24 mg/dL   Calcium 8.6 (L) 8.9 - 10.3  mg/dL   GFR calc non Af Amer >60 >60 mL/min   GFR calc Af Amer >60 >60 mL/min    Comment: (NOTE) The eGFR has been calculated using the CKD EPI equation. This calculation has not been validated in all clinical situations. eGFR's persistently <60 mL/min signify possible Chronic Kidney Disease.    Anion gap 8 5 - 15  CBC     Status: Abnormal   Collection Time: 01/27/16  2:34 PM  Result Value Ref Range   WBC 7.8 4.0 - 10.5 K/uL   RBC 5.93 (H) 4.22 - 5.81 MIL/uL   Hemoglobin 18.0 (H) 13.0 - 17.0 g/dL   HCT 50.4 39.0 - 52.0 %   MCV 85.0 78.0 - 100.0 fL   MCH 30.4 26.0 - 34.0 pg   MCHC 35.7 30.0 - 36.0 g/dL   RDW 14.2 11.5 - 15.5 %   Platelets  108 (L) 150 - 400 K/uL    Comment: SPECIMEN CHECKED FOR CLOTS PLATELET COUNT CONFIRMED BY SMEAR   Troponin I     Status: None   Collection Time: 01/27/16  2:34 PM  Result Value Ref Range   Troponin I <0.03 <0.031 ng/mL    Comment:        NO INDICATION OF MYOCARDIAL INJURY.   I-stat troponin, ED     Status: None   Collection Time: 01/27/16  2:50 PM  Result Value Ref Range   Troponin i, poc 0.02 0.00 - 0.08 ng/mL   Comment 3            Comment: Due to the release kinetics of cTnI, a negative result within the first hours of the onset of symptoms does not rule out myocardial infarction with certainty. If myocardial infarction is still suspected, repeat the test at appropriate intervals.   Brain natriuretic peptide     Status: Abnormal   Collection Time: 01/27/16  2:51 PM  Result Value Ref Range   B Natriuretic Peptide 205.0 (H) 0.0 - 100.0 pg/mL  Urine rapid drug screen (hosp performed)     Status: None   Collection Time: 01/27/16  3:34 PM  Result Value Ref Range   Opiates NONE DETECTED NONE DETECTED   Cocaine NONE DETECTED NONE DETECTED   Benzodiazepines NONE DETECTED NONE DETECTED   Amphetamines NONE DETECTED NONE DETECTED   Tetrahydrocannabinol NONE DETECTED NONE DETECTED   Barbiturates NONE DETECTED NONE DETECTED    Comment:        DRUG SCREEN FOR MEDICAL PURPOSES ONLY.  IF CONFIRMATION IS NEEDED FOR ANY PURPOSE, NOTIFY LAB WITHIN 5 DAYS.        LOWEST DETECTABLE LIMITS FOR URINE DRUG SCREEN Drug Class       Cutoff (ng/mL) Amphetamine      1000 Barbiturate      200 Benzodiazepine   034 Tricyclics       917 Opiates          300 Cocaine          300 THC              50   I-stat troponin, ED     Status: None   Collection Time: 01/27/16  6:33 PM  Result Value Ref Range   Troponin i, poc 0.04 0.00 - 0.08 ng/mL   Comment 3            Comment: Due to the release kinetics of cTnI, a negative result within the first hours of the onset of symptoms does not rule  out myocardial infarction with certainty. If myocardial infarction is still suspected, repeat the test at appropriate intervals.   MRSA PCR Screening     Status: None   Collection Time: 01/27/16  9:41 PM  Result Value Ref Range   MRSA by PCR NEGATIVE NEGATIVE    Comment:        The GeneXpert MRSA Assay (FDA approved for NASAL specimens only), is one component of a comprehensive MRSA colonization surveillance program. It is not intended to diagnose MRSA infection nor to guide or monitor treatment for MRSA infections.   CBC     Status: Abnormal   Collection Time: 01/28/16  9:46 AM  Result Value Ref Range   WBC 8.4 4.0 - 10.5 K/uL   RBC 5.17 4.22 - 5.81 MIL/uL   Hemoglobin 15.6 13.0 - 17.0 g/dL   HCT 44.0 39.0 - 52.0 %   MCV 85.1 78.0 - 100.0 fL   MCH 30.2 26.0 - 34.0 pg   MCHC 35.5 30.0 - 36.0 g/dL   RDW 14.3 11.5 - 15.5 %   Platelets 136 (L) 150 - 400 K/uL  TSH     Status: None   Collection Time: 01/28/16  9:46 AM  Result Value Ref Range   TSH 1.838 0.350 - 4.500 uIU/mL  Troponin I     Status: None   Collection Time: 01/28/16  9:46 AM  Result Value Ref Range   Troponin I <0.03 <0.031 ng/mL    Comment:        NO INDICATION OF MYOCARDIAL INJURY.   Hemoglobin A1c     Status: None   Collection Time: 01/28/16  9:46 AM  Result Value Ref Range   Hgb A1c MFr Bld 5.6 4.8 - 5.6 %    Comment: (NOTE)         Pre-diabetes: 5.7 - 6.4         Diabetes: >6.4         Glycemic control for adults with diabetes: <7.0    Mean Plasma Glucose 114 mg/dL    Comment: (NOTE) Performed At: [facility] [place], [place] [id number] Lindon Romp MD GS:[id number]   Lipid panel     Status: Abnormal   Collection Time: 01/28/16  9:46 AM  Result Value Ref Range   Cholesterol 211 (H) 0 - 200 mg/dL   Triglycerides 86 <150 mg/dL   HDL 79 >40 mg/dL   Total CHOL/HDL Ratio 2.7 RATIO   VLDL 17 0 - 40 mg/dL   LDL Cholesterol 115 (H) 0 - 99 mg/dL    Comment:         Total Cholesterol/HDL:CHD Risk Coronary Heart Disease Risk Table                     Men   Women  1/2 Average Risk   3.4   3.3  Average Risk       5.0   4.4  2 X Average Risk   9.6   7.1  3 X Average Risk  23.4   11.0        Use the calculated Patient Ratio above and the CHD Risk Table to determine the patient's CHD Risk.        ATP III CLASSIFICATION (LDL):  <100     mg/dL   Optimal  100-129  mg/dL   Near or Above  Optimal  130-159  mg/dL   Borderline  160-189  mg/dL   High  >190     mg/dL   Very High   Basic metabolic panel     Status: Abnormal   Collection Time: 01/28/16  9:46 AM  Result Value Ref Range   Sodium 142 135 - 145 mmol/L   Potassium 3.7 3.5 - 5.1 mmol/L   Chloride 106 101 - 111 mmol/L   CO2 25 22 - 32 mmol/L   Glucose, Bld 137 (H) 65 - 99 mg/dL   BUN 10 6 - 20 mg/dL   Creatinine, Ser 1.19 0.61 - 1.24 mg/dL   Calcium 9.3 8.9 - 10.3 mg/dL   GFR calc non Af Amer >60 >60 mL/min   GFR calc Af Amer >60 >60 mL/min    Comment: (NOTE) The eGFR has been calculated using the CKD EPI equation. This calculation has not been validated in all clinical situations. eGFR's persistently <60 mL/min signify possible Chronic Kidney Disease.    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 Hypertension. Sickle cell trait. EXAM: CT ANGIOGRAPHY HEAD AND NECK TECHNIQUE: Multidetector CT imaging of the head and neck was performed using the standard protocol during bolus administration of intravenous contrast. Multiplanar CT image reconstructions and MIPs were obtained to evaluate the vascular anatomy. Carotid stenosis measurements (when applicable) are obtained utilizing NASCET criteria, using the distal internal carotid diameter as  the denominator. CONTRAST:  100 mL Isovue 370 IV. 2 injections of 50 mL contrast were performed. The first attempt had suboptimal arterial contrast due to late timing of the scan. New IV was started the right arm and a repeat study was performed which is diagnostic. COMPARISON:  CT head 01/19/2015 FINDINGS: CT HEAD Brain: Ventricle size normal. Negative for acute or chronic infarction. Negative for intracranial hemorrhage or mass. Calvarium and skull base: Negative Paranasal sinuses: Negative Orbits: Negative CTA NECK Aortic arch: Normal aortic arch. No atherosclerotic disease or calcification. Proximal great vessels widely patent. Right carotid system: Normal right carotid. No evidence of atherosclerotic disease or dissection. No significant stenosis Left carotid system: Normal left carotid. Negative for atherosclerotic disease or dissection. No significant stenosis Vertebral arteries:Normal vertebral arteries bilaterally without stenosis or dissection. Skeleton: Multiple dental caries and periapical lucencies around teeth. This is most prominent right lower third molar due to infection. Other neck: Negative for mass or adenopathy in the neck. Mild apical emphysema with subpleural cysts bilaterally. CTA HEAD Anterior circulation: The right cavernous carotid artery is widely patent without stenosis. Right posterior communicating artery patent. Right anterior and middle cerebral arteries widely patent without stenosis. Left cavernous carotid artery widely patent. Left posterior communicating artery patent. No aneurysm. Left anterior and middle cerebral arteries widely patent. Posterior circulation: Both vertebral arteries patent to the basilar. PICA patent bilaterally. Basilar widely patent. Superior cerebellar and posterior cerebral arteries patent bilaterally. Negative for aneurysm in the posterior circulation Venous sinuses: Patent Anatomic variants: None Delayed phase: Normal enhancement on delayed imaging  IMPRESSION: Normal CTA of the head and neck. No evidence of aneurysm. No dissection or stenosis. Electronically Signed   By: Franchot Gallo M.D.   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 Hypertension. Sickle cell trait. EXAM: CT ANGIOGRAPHY HEAD AND NECK TECHNIQUE: Multidetector CT imaging of the head and neck was performed using the standard protocol during bolus administration of intravenous contrast. Multiplanar CT image reconstructions and MIPs were obtained to evaluate the vascular anatomy. Carotid stenosis measurements (when applicable) are obtained utilizing NASCET criteria, using the distal internal carotid diameter as the denominator. CONTRAST:  100 mL Isovue 370 IV. 2 injections of 50 mL contrast were performed. The first attempt had suboptimal arterial contrast due to late timing of the scan. New IV was started the right arm and a repeat study was performed which is diagnostic. COMPARISON:  CT head 01/19/2015 FINDINGS: CT HEAD Brain: Ventricle size normal. Negative for acute or chronic infarction. Negative for intracranial hemorrhage or mass. Calvarium and skull base: Negative Paranasal sinuses: Negative Orbits: Negative CTA NECK Aortic arch: Normal aortic arch. No atherosclerotic disease or calcification. Proximal great vessels widely patent. Right carotid system: Normal right carotid. No evidence of atherosclerotic disease or dissection. No significant stenosis Left carotid system: Normal left carotid. Negative for atherosclerotic disease or dissection. No significant stenosis Vertebral arteries:Normal vertebral arteries bilaterally without stenosis or dissection. Skeleton: Multiple dental caries and periapical lucencies around teeth. This is most prominent right lower third molar due to infection. Other neck: Negative for mass or adenopathy in the neck. Mild apical emphysema with subpleural cysts bilaterally. CTA HEAD Anterior  circulation: The right cavernous carotid artery is widely patent without stenosis. Right posterior communicating artery patent. Right anterior and middle cerebral arteries widely patent without stenosis. Left cavernous carotid artery widely patent. Left posterior communicating artery patent. No aneurysm. Left anterior and middle cerebral arteries widely patent. Posterior circulation: Both vertebral arteries patent to the basilar. PICA patent bilaterally. Basilar widely patent. Superior cerebellar and posterior cerebral arteries patent bilaterally. Negative for aneurysm in the posterior circulation Venous sinuses: Patent Anatomic variants: None Delayed phase: Normal enhancement on delayed imaging IMPRESSION: Normal CTA of the head and neck. No evidence of aneurysm. No dissection or stenosis. Electronically Signed   By: Charles  Clark M.D.   On: 01/28/2016 12:46   Ct Angio Chest Pe W/cm &/or Wo Cm  01/27/2016  CLINICAL DATA:  Chest pain and shortness of Breath EXAM: CT ANGIOGRAPHY CHEST WITH CONTRAST TECHNIQUE: Multidetector CT imaging of the chest was performed using the standard protocol during bolus administration of intravenous contrast. Multiplanar CT image reconstructions and MIPs were obtained to evaluate the vascular anatomy. CONTRAST:  100 mL Isovue 370. COMPARISON:  None. FINDINGS: Lungs are well aerated bilaterally. No focal infiltrate or sizable effusion is seen. Diffuse emphysematous changes are seen. The thoracic inlet is within normal limits. The thoracic aorta shows no aneurysmal dilatation or dissection. The pulmonary artery shows a normal branching pattern without focal filling defect to suggest pulmonary embolism. No significant hilar or mediastinal adenopathy is noted. A few small aortic or pulmonary window nodes are seen. Scanning into the upper abdomen shows no acute abnormality. The bony structures show no acute abnormality. Review of the MIP images confirms the above findings. IMPRESSION: No  evidence of pulmonary emboli. Diffuse emphysematous changes. Electronically Signed   By: Mark  Lukens M.D.   On: 01/27/2016 17:53   Mr Brain W Wo Contrast  01/28/2016  CLINICAL DATA:  Third nerve palsy. Left eye vision change. History of neurofibromatosis and sickle cell trait EXAM: MRI HEAD AND ORBITS WITHOUT AND WITH CONTRAST   TECHNIQUE: Multiplanar, multiecho pulse sequences of the brain and surrounding structures were obtained without and with intravenous contrast. Multiplanar, multiecho pulse sequences of the orbits and surrounding structures were obtained including fat saturation techniques, before and after intravenous contrast administration. CONTRAST:  37m MULTIHANCE GADOBENATE DIMEGLUMINE 529 MG/ML IV SOLN COMPARISON:  CT head 01/28/2016 FINDINGS: MRI HEAD FINDINGS Ventricle size normal.  Cerebral volume normal. Negative for acute or chronic infarction. Negative for demyelinating disease.  Cerebral white matter normal. Negative for intracranial hemorrhage.  Negative for mass or edema. Postcontrast imaging reveals normal enhancement of the brain and surrounding coverings. No enhancing mass. Leptomeningeal enhancement normal. Pituitary normal in size. Normal skullbase. Paranasal sinuses clear. MRI ORBITS FINDINGS Normal shape of the globe. Optic nerve normal in size and signal bilaterally. No enhancing mass in the optic nerve. Extraocular muscles are normal. Orbital fat is normal without infiltration or mass. Serpiginous tubular structures are present in the superior orbit bilaterally. These show enhancement postcontrast. These most likely are dilated veins. Neurofibromas considered less likely given their elongated tubular serpiginous shape. No evidence of cavernous sinus thrombosis or cavernous carotid fistula. The cavernous sinus enhances normally and symmetrically without enlargement or filling defect. No enhancing mass lesion in the orbit. IMPRESSION: Negative MRI of the brain with contrast. No  evidence of infarct or mass Serpiginous tubular structures in the superior orbit bilaterally most compatible with dilated veins . 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 No evidence of cavernous sinus thrombosis or cavernous carotid fistula. Electronically Signed   By: CFranchot GalloM.D.   On: 01/28/2016 16:15   Dg Chest Port 1 View  01/27/2016  CLINICAL DATA:  Chest pain EXAM: PORTABLE CHEST 1 VIEW COMPARISON:  None. FINDINGS: Cardiac shadow is within normal limits. The lungs are well aerated bilaterally. Increased density is noted in the medial right lung base which may represent some early infiltrate. No acute bony abnormality is noted. IMPRESSION: Likely early infiltrate in the medial right lung base. Electronically Signed   By: MInez CatalinaM.D.   On: 01/27/2016 15:49   Mr ODarnelle CatalanWo/w Cm  01/28/2016  CLINICAL DATA:  Third nerve palsy. Left eye vision change. History of neurofibromatosis and sickle cell trait EXAM: MRI HEAD AND ORBITS WITHOUT AND WITH CONTRAST TECHNIQUE: Multiplanar, multiecho pulse sequences of the brain and surrounding structures were obtained without and with intravenous contrast. Multiplanar, multiecho pulse sequences of the orbits and surrounding structures were obtained including fat saturation techniques, before and after intravenous contrast administration. CONTRAST:  25mMULTIHANCE GADOBENATE DIMEGLUMINE 529 MG/ML IV SOLN COMPARISON:  CT head 01/28/2016 FINDINGS: MRI HEAD FINDINGS Ventricle size normal.  Cerebral volume normal. Negative for acute or chronic infarction. Negative for demyelinating disease.  Cerebral white matter normal. Negative for intracranial hemorrhage.  Negative for mass or edema. Postcontrast imaging reveals normal enhancement of the brain and surrounding coverings. No enhancing mass. Leptomeningeal enhancement normal. Pituitary normal in size. Normal skullbase. Paranasal sinuses clear. MRI ORBITS FINDINGS Normal shape of the globe. Optic nerve normal in size and  signal bilaterally. No enhancing mass in the optic nerve. Extraocular muscles are normal. Orbital fat is normal without infiltration or mass. Serpiginous tubular structures are present in the superior orbit bilaterally. These show enhancement postcontrast. These most likely are dilated veins. Neurofibromas considered less likely given their elongated tubular serpiginous shape. No evidence of cavernous sinus thrombosis or cavernous carotid fistula. The cavernous sinus enhances normally and symmetrically without enlargement or filling defect. No enhancing mass lesion in the orbit. IMPRESSION: Negative  MRI of the brain with contrast. No evidence of infarct or mass Serpiginous tubular structures in the superior orbit bilaterally most compatible with dilated veins . 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 No evidence of cavernous sinus thrombosis or cavernous carotid fistula. Electronically Signed   By: Franchot Gallo M.D.   On: 01/28/2016 16:15    Assessment:  Principal Problem:   Hypertensive emergency Active Problems:   Chest pain   Hypertensive urgency   Exotropia of left eye   Cardiomyopathy ([place])  Plan:  1.  Mr. Bialas has a newly diagnosed cardiomyopathy by echo. His blood pressure is now better controlled, but in light of the new findings, will have to re-adjust BP meds for CHF benefit. Given chest pain, I would favor definitive L/RHC to assess for CAD. Although it is less likely given age, would not want to miss an opportunity to possibly improve LV function. Discussed risks/benefits/alternatives to [facility] today and he is willing to proceed. Keep NPO for cath this afternoon. Switch labetalol to carvedilol. Continue amlodipine/chlorthalidone for now. Will assess SVR on RHC today along with filling pressures. Would start ARB after cath tomorrow. Continue low dose aspirin.  Time Spent Directly with Patient:  15 minutes   Length of Stay:  LOS: 2 days   Pixie Casino, MD, [facility] Attending  Cardiologist [place] 01/29/2016, 10:46 AM

## 2016-01-30 ENCOUNTER — Encounter (HOSPITAL_COMMUNITY): Payer: Self-pay | Admitting: Cardiovascular Disease

## 2016-01-30 DIAGNOSIS — R072 Precordial pain: Secondary | ICD-10-CM | POA: Diagnosis not present

## 2016-01-30 DIAGNOSIS — I429 Cardiomyopathy, unspecified: Secondary | ICD-10-CM | POA: Diagnosis not present

## 2016-01-30 DIAGNOSIS — I161 Hypertensive emergency: Secondary | ICD-10-CM | POA: Diagnosis not present

## 2016-01-30 DIAGNOSIS — H5010 Unspecified exotropia: Secondary | ICD-10-CM | POA: Diagnosis not present

## 2016-01-30 LAB — BASIC METABOLIC PANEL
Anion gap: 12 (ref 5–15)
BUN: 19 mg/dL (ref 6–20)
CO2: 22 mmol/L (ref 22–32)
Calcium: 9.4 mg/dL (ref 8.9–10.3)
Chloride: 106 mmol/L (ref 101–111)
Creatinine, Ser: 1.22 mg/dL (ref 0.61–1.24)
GFR calc Af Amer: >60 mL/min (ref >60)
GFR calc non Af Amer: >60 mL/min (ref >60)
Glucose, Bld: 82 mg/dL (ref 65–99)
Potassium: 3.7 mmol/L (ref 3.5–5.1)
Sodium: 140 mmol/L (ref 135–145)

## 2016-01-30 LAB — CBC
HCT: 46.7 % (ref 39.0–52.0)
Hemoglobin: 16.2 g/dL (ref 13.0–17.0)
MCH: 29.6 pg (ref 26.0–34.0)
MCHC: 34.7 g/dL (ref 30.0–36.0)
MCV: 85.2 fL (ref 78.0–100.0)
Platelets: 121 10*3/uL — ABNORMAL LOW (ref 150–400)
RBC: 5.48 MIL/uL (ref 4.22–5.81)
RDW: 14.5 % (ref 11.5–15.5)
WBC: 5.4 10*3/uL (ref 4.0–10.5)

## 2016-01-30 MED ORDER — CARVEDILOL 12.5 MG PO TABS: 12.5000 mg | ORAL_TABLET | Freq: Two times a day (BID) | ORAL | Status: DC

## 2016-01-30 MED ORDER — CHLORTHALIDONE 25 MG PO TABS: 25.0000 mg | ORAL_TABLET | Freq: Every day | ORAL | Status: DC

## 2016-01-30 MED ORDER — AMLODIPINE BESYLATE 5 MG PO TABS: 5.0000 mg | ORAL_TABLET | Freq: Every day | ORAL | Status: DC

## 2016-01-30 MED ORDER — LOSARTAN POTASSIUM 50 MG PO TABS: 50.0000 mg | ORAL_TABLET | Freq: Every day | ORAL | Status: DC

## 2016-01-30 MED ORDER — ASPIRIN 81 MG PO TBEC: 81.0000 mg | DELAYED_RELEASE_TABLET | Freq: Every day | ORAL | Status: DC

## 2016-01-30 NOTE — Discharge Summary (Signed)
Discharge Summary    Patient ID: John Cain,  MRN: [id number], DOB/AGE: [id number] y.o.  Admit date: 01/27/2016 Discharge date: 01/30/2016  Primary Care Provider: No PCP Per Patient Primary Cardiologist: Dr. Rennis Golden  Discharge Diagnoses    Principal Problem:   Hypertensive emergency Active Problems:   Chest pain   Hypertensive urgency   Exotropia of left eye   Cardiomyopathy (HCC)   Afferent pupillary defect   NICM (nonischemic cardiomyopathy) (HCC)   Allergies Allergies  Allergen Reactions  . Bee Venom Hives    Diagnostic Studies/Procedures    2D Echo 4/13 Study Conclusions  - Procedure narrative: Transthoracic echocardiography. The study  was technically difficult. Intravenous contrast (Definity) was  administered. - Left ventricle: The cavity size was normal. Wall thickness was  increased in a pattern of mild LVH. Thick LV apical false  tendinae. No mural thrombus with Definity contrast. Systolic  function was severely reduced. The estimated ejection fraction  was in the range of 20% to 25%. Global hypokinesis. The study is  not technically sufficient to allow evaluation of LV diastolic  function. - Mitral valve: Mildly thickened leaflets . There was moderate  regurgitation. - Left atrium: Severely dilated at 47 ml/m2. - Right ventricle: The cavity size was mildly dilated. The  moderator band was prominent. Systolic function is reduced. - Right atrium: The atrium was mildly dilated. - Tricuspid valve: There was mild regurgitation. - Pulmonary arteries: Dilated. PA peak pressure: 29 mm Hg (S). - Inferior vena cava: The vessel was normal in size. The  respirophasic diameter changes were in the normal range (>= 50%),  consistent with normal central venous pressure.  Impressions:  - Technically difficult study. LVEF 20-25%, mild LVH, severe global  hypokinesis, prominent LV false tendons, no mural thrombus,  moderate MR, mild TR, RVSP  29 mmHg, mild RAE, severe LAE.   LHC 01/29/16 Procedures    Right/Left Heart Cath and Coronary Angiography    Conclusion    Dilated nonischemic cardiomyopathy with severely dysfunction with diffuse global hypokinesis and ejection fraction of 25th 25%.  Normal coronary arteries.  Normal right heart pressures without evidence for primary hypertension.  Cardiac output by the Fick method was 5.2 L/m with a cardiac index of 2.3 L/m/m      History of Present Illness     35 y/o man with h/o sickle trait and neurofibromatosis who presented 01/27/16 as a transfer from [facility] with chest pain. He reported pain described as severe heaviness in chest with associated SOB. No other symptoms. Discomfort was severe enough that he went to [facility] ED for evaluation. On arrival, vitals showed severe hypertension with BP at 173/116, HR 116 and ECG with ST, LAD, and LVH with repolarization abnormalities. Initial labs notable for elevated Hg, negative troponin and UDS. CTA chest also negative. On arrival here at [facility], he was on a NTG drip at 40 mcg, hypertensive and chest pain free. He stated that his pain resolved about 2 hours prior to arriving here. Of note, no known family history as he is adopted.   Hospital Course     For additional BP control, amlodipine, labetalol and chlorthalidone were added. 2D echo was performed which showed severe LV dysfunction with reduced EF down to 20-25%. LHC was recommended, however prior to cath patient complained of diplopia. Neurology was consulted. CT of head and neck was unremarkable, as was MRI. He then underwent LHC on 01/29/16 which showed normal coronaries and normal right heart pressures  without evidence for primary hypertension. He tolerated the procedure well and left the cath lab in stable condition. His meds were adjusted given his NICM. His BB was changed from labetalol to Coreg. Amlodipine was reduced to allow room for addition of ARB. He was started on  losartan. He tolerated medication changes well and BP remained stable. He had no post cath complications. He was last seen and examined by Dr. Rennis Golden, who determined he was stable for discharge home. 5-7 day TOC f/u has been arranged with Cline Crock, PA-C. He has 1 month f/u with Dr. Rennis Golden on 02/27/16. He will need office BMP to ensure renal function and K are stable after addition of ARB and thiazide diuretic.   Consultants: neurology   Discharge Vitals Blood pressure 120/82, pulse 80, temperature 98.1 F (36.7 C), temperature source Oral, resp. rate 15, height 6\' 2"  (1.88 m), weight 202 lb 13.2 oz (92 kg), SpO2 98 %.  Filed Weights   01/27/16 1427 01/27/16 2140 01/29/16 1107  Weight: 230 lb (104.327 kg) 211 lb 9.6 oz (95.981 kg) 202 lb 13.2 oz (92 kg)    Labs & Radiologic Studies    CBC  Recent Labs  01/29/16 1823 01/30/16 0251  WBC 5.1 5.4  HGB 17.1* 16.2  HCT 48.5 46.7  MCV 85.4 85.2  PLT 132* 121*   Basic Metabolic Panel  Recent Labs  01/28/16 0946 01/29/16 1823 01/30/16 0251  NA 142  --  140  K 3.7  --  3.7  CL 106  --  106  CO2 25  --  22  GLUCOSE 137*  --  82  BUN 10  --  19  CREATININE 1.19 1.01 1.22  CALCIUM 9.3  --  9.4   Liver Function Tests No results for input(s): AST, ALT, ALKPHOS, BILITOT, PROT, ALBUMIN in the last 72 hours. No results for input(s): LIPASE, AMYLASE in the last 72 hours. Cardiac Enzymes  Recent Labs  01/27/16 1434 01/28/16 0946  TROPONINI <0.03 <0.03   BNP Invalid input(s): POCBNP D-Dimer No results for input(s): DDIMER in the last 72 hours. 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 Hypertension. Sickle cell trait.  EXAM: CT ANGIOGRAPHY HEAD AND NECK TECHNIQUE: Multidetector CT imaging of the head and neck was performed using the standard protocol during bolus administration of intravenous contrast. Multiplanar CT image reconstructions and MIPs were obtained to evaluate the vascular anatomy. Carotid stenosis measurements (when applicable) are obtained utilizing NASCET criteria, using the distal internal carotid diameter as the denominator. CONTRAST:  100 mL Isovue 370 IV. 2 injections of 50 mL contrast were performed. The first attempt had suboptimal arterial contrast due to late timing of the scan. New IV was started the right arm and a repeat study was performed which is diagnostic. COMPARISON:  CT head 01/19/2015 FINDINGS: CT HEAD Brain: Ventricle size normal. Negative for acute or chronic infarction. Negative for intracranial hemorrhage or mass. Calvarium and skull base: Negative Paranasal sinuses: Negative Orbits: Negative CTA NECK Aortic arch: Normal aortic arch. No atherosclerotic disease or calcification. Proximal great vessels widely patent. Right carotid system: Normal right carotid. No  evidence of atherosclerotic disease or dissection. No significant stenosis Left carotid system: Normal left carotid. Negative for atherosclerotic disease or dissection. No significant stenosis Vertebral arteries:Normal vertebral arteries bilaterally without stenosis or dissection. Skeleton: Multiple dental caries and periapical lucencies around teeth. This is most prominent right lower third molar due to infection. Other neck: Negative for mass or adenopathy in the neck. Mild apical emphysema with subpleural cysts bilaterally. CTA HEAD Anterior circulation: The right cavernous carotid artery is widely patent without stenosis. Right posterior communicating artery patent. Right anterior and middle cerebral arteries widely patent without stenosis. Left cavernous carotid artery widely patent. Left posterior communicating artery patent. No  aneurysm. Left anterior and middle cerebral arteries widely patent. Posterior circulation: Both vertebral arteries patent to the basilar. PICA patent bilaterally. Basilar widely patent. Superior cerebellar and posterior cerebral arteries patent bilaterally. Negative for aneurysm in the posterior circulation Venous sinuses: Patent Anatomic variants: None Delayed phase: Normal enhancement on delayed imaging IMPRESSION: Normal CTA of the head and neck. No evidence of aneurysm. No dissection or stenosis. Electronically Signed   By: Marlan Palau M.D.   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 Hypertension. Sickle cell trait. EXAM: CT ANGIOGRAPHY HEAD AND NECK TECHNIQUE: Multidetector CT imaging of the head and neck was performed using the standard protocol during bolus administration of intravenous contrast. Multiplanar CT image reconstructions and MIPs were obtained to evaluate the vascular anatomy. Carotid stenosis measurements (when applicable) are obtained utilizing NASCET criteria, using the distal internal carotid diameter as the denominator. CONTRAST:  100 mL Isovue 370 IV. 2 injections of 50 mL contrast were performed. The first attempt had suboptimal arterial contrast due to late timing of the scan. New IV was started the right arm and a repeat study was performed which is diagnostic. COMPARISON:  CT head 01/19/2015 FINDINGS: CT HEAD Brain: Ventricle size normal. Negative for acute or chronic infarction. Negative for intracranial hemorrhage or mass. Calvarium and skull base: Negative Paranasal sinuses: Negative Orbits: Negative CTA NECK Aortic arch: Normal aortic arch. No atherosclerotic disease or calcification. Proximal great vessels widely patent. Right carotid system: Normal right carotid. No evidence of atherosclerotic disease or dissection. No significant stenosis Left carotid system: Normal left carotid. Negative for atherosclerotic  disease or dissection. No significant stenosis Vertebral arteries:Normal vertebral arteries bilaterally without stenosis or dissection. Skeleton: Multiple dental caries and periapical lucencies around teeth. This is most prominent right lower third molar due to infection. Other neck: Negative for mass or adenopathy in the neck. Mild apical emphysema with subpleural cysts bilaterally. CTA HEAD Anterior circulation: The right cavernous carotid artery is widely patent without stenosis. Right posterior communicating artery patent. Right anterior and middle cerebral arteries widely patent without stenosis. Left cavernous carotid artery widely patent. Left posterior communicating artery patent. No aneurysm. Left anterior and middle cerebral arteries widely patent. Posterior circulation: Both vertebral arteries patent to the basilar. PICA patent bilaterally. Basilar widely patent. Superior cerebellar and posterior cerebral arteries patent bilaterally. Negative for aneurysm in the posterior circulation Venous sinuses: Patent Anatomic variants: None Delayed phase: Normal enhancement on delayed imaging IMPRESSION: Normal CTA of the head and neck. No evidence of aneurysm. No dissection or stenosis. Electronically Signed   By: Marlan Palau M.D.   On: 01/28/2016 12:46   Ct Angio Chest Pe W/cm &/or Wo Cm  01/27/2016  CLINICAL DATA:  Chest pain and shortness of Breath EXAM: CT ANGIOGRAPHY CHEST WITH  CONTRAST TECHNIQUE: Multidetector CT imaging of the chest was performed using the standard protocol during bolus administration of intravenous contrast. Multiplanar CT image reconstructions and MIPs were obtained to evaluate the vascular anatomy. CONTRAST:  100 mL Isovue 370. COMPARISON:  None. FINDINGS: Lungs are well aerated bilaterally. No focal infiltrate or sizable effusion is seen. Diffuse emphysematous changes are seen. The thoracic inlet is within normal limits. The thoracic aorta shows no aneurysmal dilatation or  dissection. The pulmonary artery shows a normal branching pattern without focal filling defect to suggest pulmonary embolism. No significant hilar or mediastinal adenopathy is noted. A few small aortic or pulmonary window nodes are seen. Scanning into the upper abdomen shows no acute abnormality. The bony structures show no acute abnormality. Review of the MIP images confirms the above findings. IMPRESSION: No evidence of pulmonary emboli. Diffuse emphysematous changes. Electronically Signed   By: Alcide Clever M.D.   On: 01/27/2016 17:53   Mr Laqueta Jean ZO Contrast  01/28/2016  CLINICAL DATA:  Third nerve palsy. Left eye vision change. History of neurofibromatosis and sickle cell trait EXAM: MRI HEAD AND ORBITS WITHOUT AND WITH CONTRAST TECHNIQUE: Multiplanar, multiecho pulse sequences of the brain and surrounding structures were obtained without and with intravenous contrast. Multiplanar, multiecho pulse sequences of the orbits and surrounding structures were obtained including fat saturation techniques, before and after intravenous contrast administration. CONTRAST:  20mL MULTIHANCE GADOBENATE DIMEGLUMINE 529 MG/ML IV SOLN COMPARISON:  CT head 01/28/2016 FINDINGS: MRI HEAD FINDINGS Ventricle size normal.  Cerebral volume normal. Negative for acute or chronic infarction. Negative for demyelinating disease.  Cerebral white matter normal. Negative for intracranial hemorrhage.  Negative for mass or edema. Postcontrast imaging reveals normal enhancement of the brain and surrounding coverings. No enhancing mass. Leptomeningeal enhancement normal. Pituitary normal in size. Normal skullbase. Paranasal sinuses clear. MRI ORBITS FINDINGS Normal shape of the globe. Optic nerve normal in size and signal bilaterally. No enhancing mass in the optic nerve. Extraocular muscles are normal. Orbital fat is normal without infiltration or mass. Serpiginous tubular structures are present in the superior orbit bilaterally. These show  enhancement postcontrast. These most likely are dilated veins. Neurofibromas considered less likely given their elongated tubular serpiginous shape. No evidence of cavernous sinus thrombosis or cavernous carotid fistula. The cavernous sinus enhances normally and symmetrically without enlargement or filling defect. No enhancing mass lesion in the orbit. IMPRESSION: Negative MRI of the brain with contrast. No evidence of infarct or mass Serpiginous tubular structures in the superior orbit bilaterally most compatible with dilated veins . 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 No evidence of cavernous sinus thrombosis or cavernous carotid fistula. Electronically Signed   By: Marlan Palau M.D.   On: 01/28/2016 16:15   Dg Chest Port 1 View  01/27/2016  CLINICAL DATA:  Chest pain EXAM: PORTABLE CHEST 1 VIEW COMPARISON:  None. FINDINGS: Cardiac shadow is within normal limits. The lungs are well aerated bilaterally. Increased density is noted in the medial right lung base which may represent some early infiltrate. No acute bony abnormality is noted. IMPRESSION: Likely early infiltrate in the medial right lung base. Electronically Signed   By: Alcide Clever M.D.   On: 01/27/2016 15:49   Mr Birdie Hopes Wo/w Cm  01/28/2016  CLINICAL DATA:  Third nerve palsy. Left eye vision change. History of neurofibromatosis and sickle cell trait EXAM: MRI HEAD AND ORBITS WITHOUT AND WITH CONTRAST TECHNIQUE: Multiplanar, multiecho pulse sequences of the brain and surrounding structures were obtained without and with intravenous contrast.  Multiplanar, multiecho pulse sequences of the orbits and surrounding structures were obtained including fat saturation techniques, before and after intravenous contrast administration. CONTRAST:  20mL MULTIHANCE GADOBENATE DIMEGLUMINE 529 MG/ML IV SOLN COMPARISON:  CT head 01/28/2016 FINDINGS: MRI HEAD FINDINGS Ventricle size normal.  Cerebral volume normal. Negative for acute or chronic infarction. Negative for  demyelinating disease.  Cerebral white matter normal. Negative for intracranial hemorrhage.  Negative for mass or edema. Postcontrast imaging reveals normal enhancement of the brain and surrounding coverings. No enhancing mass. Leptomeningeal enhancement normal. Pituitary normal in size. Normal skullbase. Paranasal sinuses clear. MRI ORBITS FINDINGS Normal shape of the globe. Optic nerve normal in size and signal bilaterally. No enhancing mass in the optic nerve. Extraocular muscles are normal. Orbital fat is normal without infiltration or mass. Serpiginous tubular structures are present in the superior orbit bilaterally. These show enhancement postcontrast. These most likely are dilated veins. Neurofibromas considered less likely given their elongated tubular serpiginous shape. No evidence of cavernous sinus thrombosis or cavernous carotid fistula. The cavernous sinus enhances normally and symmetrically without enlargement or filling defect. No enhancing mass lesion in the orbit. IMPRESSION: Negative MRI of the brain with contrast. No evidence of infarct or mass Serpiginous tubular structures in the superior orbit bilaterally most compatible with dilated veins . 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 No evidence of cavernous sinus thrombosis or cavernous carotid fistula. Electronically Signed   By: Marlan Palau M.D.   On: 01/28/2016 16:15   Disposition   Pt is being discharged home today in good condition.  Follow-up Plans & Appointments    Follow-up Information    Follow up with Janetta Hora, PA-C On 02/05/2016.   Specialties:  Cardiology, Radiology   Why:  8:00 AM (post hospital cardiolody follow up)   Contact information:   [address] [place] [address] [phone]       Follow up with Chrystie Nose, MD On 02/27/2016.   Specialty:  Cardiology   Why:  11:45 AM Cardiology follow-up   Contact information:   [address] [place]  [address] [phone]      Discharge Instructions    Diet - low sodium heart healthy    Complete by:  As directed      Increase activity slowly    Complete by:  As directed            Discharge Medications   Current Discharge Medication List    START taking these medications   Details  amLODipine (NORVASC) 5 MG tablet Take 1 tablet (5 mg total) by mouth daily. Qty: 30 tablet, Refills: 5    aspirin EC 81 MG EC tablet Take 1 tablet (81 mg total) by mouth daily.    carvedilol (COREG) 12.5 MG tablet Take 1 tablet (12.5 mg total) by mouth 2 (two) times daily with a meal. Qty: 60 tablet, Refills: 5    chlorthalidone (HYGROTON) 25 MG tablet Take 1 tablet (25 mg total) by mouth daily. Qty: 30 tablet, Refills: 5    losartan (COZAAR) 50 MG tablet Take 1 tablet (50 mg total) by mouth daily. Qty: 30 tablet, Refills: 5         Aspirin prescribed at discharge?  Yes High Intensity Statin Prescribed? (Lipitor 40-80mg  or Crestor 20-40mg ): No: no CAD on cath Beta Blocker Prescribed? Yes For EF <40%, was ACEI/ARB Prescribed? Yes ADP Receptor Inhibitor Prescribed? (i.e. Plavix etc.-Includes Medically Managed Patients): No:  For EF <40%, Aldosterone Inhibitor Prescribed?  No: consider adding in OP setting. Was EF assessed during THIS hospitalization? Yes Was Cardiac Rehab II ordered? (Included Medically managed Patients): No:    Outstanding Labs/Studies   F/u BMP at [facility] visit to monitor renal function and K in the setting of ARB and Thiazide diuretic use.   Duration of Discharge Encounter   Greater than 30 minutes including physician time.  Signed, Darryn Kydd PA-C 01/30/2016, 12:40 PM

## 2016-01-30 NOTE — Progress Notes (Signed)
PT discharge teaching completed with wife at bedside verbalized understanding. IVs removed and pt taken to car.

## 2016-01-30 NOTE — Progress Notes (Signed)
CM met with patient to discuss discharge planning. Patient lives in [place] and states that he does not have a PCP.  Patient was provided with the contact information for the [facility].  Per Development worker, community, Langley Gauss, patient has an Set designer that only covers outpatient physician visits and also covers generic prescription medications.  This information was verified with patient.  No further discharge needs identified at this time Bedside RN updated.  Lorne Skeens RN, MSN [phone]

## 2016-01-30 NOTE — Progress Notes (Signed)
DAILY PROGRESS NOTE  Subjective:  Cath yesterday revealed normal coronaries with severe LV dysfunction and CO of 5.2L/min and CI of 2.3 L/min/m2. LVEDP was normal and PCWP was 9. BP has remained around 120/80 for 2 days.  Objective:   Temp:  [97.7 F (36.5 C)-98.6 F (37 C)] 98.1 F (36.7 C) (04/14 1108) Pulse Rate:  [0-93] 80 (04/14 1108) Resp:  [0-28] 15 (04/14 1108) BP: (105-148)/(67-97) 120/82 mmHg (04/14 1108) SpO2:  [0 %-100 %] 98 % (04/14 1108) Weight change:   Intake/Output from previous day: 04/13 0701 - 04/14 0700 In: 612.5 [P.O.:600; I.V.:12.5] Out: 1100 [Urine:1100]  Intake/Output from this shift: Total I/O In: 360 [P.O.:360] Out: -   Medications: Current Facility-Administered Medications  Medication Dose Route Frequency Provider Last Rate Last Dose  . 0.9 %  sodium chloride infusion  250 mL Intravenous PRN Troy Sine, MD      . 0.9 %  sodium chloride infusion  250 mL Intravenous PRN Troy Sine, MD      . acetaminophen (TYLENOL) tablet 650 mg  650 mg Oral Q4H PRN Troy Sine, MD      . amLODipine (NORVASC) tablet 10 mg  10 mg Oral Daily Alphia Moh, MD   10 mg at 01/30/16 0923  . aspirin EC tablet 81 mg  81 mg Oral Daily Alphia Moh, MD   81 mg at 01/30/16 5697  . carvedilol (COREG) tablet 12.5 mg  12.5 mg Oral BID WC Pixie Casino, MD   12.5 mg at 01/30/16 9480  . chlorthalidone (HYGROTON) tablet 25 mg  25 mg Oral Daily Pixie Casino, MD   25 mg at 01/30/16 1655  . diazepam (VALIUM) tablet 5 mg  5 mg Oral Q6H PRN Troy Sine, MD      . diazepam (VALIUM) tablet 5 mg  5 mg Oral Q6H PRN Troy Sine, MD      . enoxaparin (LOVENOX) injection 40 mg  40 mg Subcutaneous Q24H Alphia Moh, MD   40 mg at 01/30/16 3748  . hydrALAZINE (APRESOLINE) injection 10 mg  10 mg Intravenous Q6H PRN Pixie Casino, MD   10 mg at 01/28/16 1745  . labetalol (NORMODYNE,TRANDATE) injection 10 mg  10 mg Intravenous Q2H PRN Alphia Moh, MD   10  mg at 01/27/16 2355  . ondansetron (ZOFRAN) injection 4 mg  4 mg Intravenous Q6H PRN Troy Sine, MD      . sodium chloride flush (NS) 0.9 % injection 3 mL  3 mL Intravenous Q12H Troy Sine, MD   3 mL at 01/30/16 0924  . sodium chloride flush (NS) 0.9 % injection 3 mL  3 mL Intravenous PRN Troy Sine, MD      . sodium chloride flush (NS) 0.9 % injection 3 mL  3 mL Intravenous Q12H Troy Sine, MD   3 mL at 01/30/16 0924  . sodium chloride flush (NS) 0.9 % injection 3 mL  3 mL Intravenous PRN Troy Sine, MD        Physical Exam: General appearance: alert and no distress Neck: no carotid bruit, no JVD and there is mild left exotropia Lungs: clear to auscultation bilaterally Heart: regular rate and rhythm, S1, S2 normal and S3 present Abdomen: soft, non-tender; bowel sounds normal; no masses,  no organomegaly Extremities: extremities normal, atraumatic, no cyanosis or edema (radial cath site without hematoma or bruit) Pulses: 2+ and symmetric Skin: Skin color, texture, turgor normal.  No rashes or lesions Neurologic: Mental status: Alert, oriented, thought content appropriate Cranial nerves: II-XII intact, except VI  Lab Results: Results for orders placed or performed during the hospital encounter of 01/27/16 (from the past 48 hour(s))  Glucose, capillary     Status: None   Collection Time: 01/29/16  8:11 AM  Result Value Ref Range   Glucose-Capillary 98 65 - 99 mg/dL   Comment 1 Notify RN   Protime-INR     Status: None   Collection Time: 01/29/16 12:20 PM  Result Value Ref Range   Prothrombin Time 14.1 11.6 - 15.2 seconds   INR 1.07 0.00 - 1.49  I-STAT 3, venous blood gas (G3P V)     Status: Abnormal   Collection Time: 01/29/16  4:10 PM  Result Value Ref Range   pH, Ven 7.350 (H) 7.250 - 7.300   pCO2, Ven 41.7 (L) 45.0 - 50.0 mmHg   pO2, Ven 36.0 31.0 - 45.0 mmHg   Bicarbonate 23.0 20.0 - 24.0 mEq/L   TCO2 24 0 - 100 mmol/L   O2 Saturation 66.0 %   Acid-base  deficit 3.0 (H) 0.0 - 2.0 mmol/L   Patient temperature HIDE    Sample type VENOUS   I-STAT 3, arterial blood gas (G3+)     Status: Abnormal   Collection Time: 01/29/16  4:35 PM  Result Value Ref Range   pH, Arterial 7.298 (L) 7.350 - 7.450   pCO2 arterial 37.8 35.0 - 45.0 mmHg   pO2, Arterial 73.0 (L) 80.0 - 100.0 mmHg   Bicarbonate 18.5 (L) 20.0 - 24.0 mEq/L   TCO2 20 0 - 100 mmol/L   O2 Saturation 93.0 %   Acid-base deficit 7.0 (H) 0.0 - 2.0 mmol/L   Patient temperature HIDE    Sample type ARTERIAL   POCT Activated clotting time     Status: None   Collection Time: 01/29/16  4:47 PM  Result Value Ref Range   Activated Clotting Time 183 seconds  CBC     Status: Abnormal   Collection Time: 01/29/16  6:23 PM  Result Value Ref Range   WBC 5.1 4.0 - 10.5 K/uL   RBC 5.68 4.22 - 5.81 MIL/uL   Hemoglobin 17.1 (H) 13.0 - 17.0 g/dL   HCT 48.5 39.0 - 52.0 %   MCV 85.4 78.0 - 100.0 fL   MCH 30.1 26.0 - 34.0 pg   MCHC 35.3 30.0 - 36.0 g/dL   RDW 14.5 11.5 - 15.5 %   Platelets 132 (L) 150 - 400 K/uL  Creatinine, serum     Status: None   Collection Time: 01/29/16  6:23 PM  Result Value Ref Range   Creatinine, Ser 1.01 0.61 - 1.24 mg/dL   GFR calc non Af Amer >60 >60 mL/min   GFR calc Af Amer >60 >60 mL/min    Comment: (NOTE) The eGFR has been calculated using the CKD EPI equation. This calculation has not been validated in all clinical situations. eGFR's persistently <60 mL/min signify possible Chronic Kidney Disease.   CBC     Status: Abnormal   Collection Time: 01/30/16  2:51 AM  Result Value Ref Range   WBC 5.4 4.0 - 10.5 K/uL   RBC 5.48 4.22 - 5.81 MIL/uL   Hemoglobin 16.2 13.0 - 17.0 g/dL   HCT 46.7 39.0 - 52.0 %   MCV 85.2 78.0 - 100.0 fL   MCH 29.6 26.0 - 34.0 pg   MCHC 34.7 30.0 - 36.0 g/dL  RDW 14.5 11.5 - 15.5 %   Platelets 121 (L) 150 - 400 K/uL  Basic metabolic panel     Status: None   Collection Time: 01/30/16  2:51 AM  Result Value Ref Range   Sodium 140 135  - 145 mmol/L   Potassium 3.7 3.5 - 5.1 mmol/L   Chloride 106 101 - 111 mmol/L   CO2 22 22 - 32 mmol/L   Glucose, Bld 82 65 - 99 mg/dL   BUN 19 6 - 20 mg/dL   Creatinine, Ser 1.22 0.61 - 1.24 mg/dL   Calcium 9.4 8.9 - 10.3 mg/dL   GFR calc non Af Amer >60 >60 mL/min   GFR calc Af Amer >60 >60 mL/min    Comment: (NOTE) The eGFR has been calculated using the CKD EPI equation. This calculation has not been validated in all clinical situations. eGFR's persistently <60 mL/min signify possible Chronic Kidney Disease.    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 Hypertension. Sickle cell trait. EXAM: CT ANGIOGRAPHY HEAD AND NECK TECHNIQUE: Multidetector CT imaging of the head and neck was performed using the standard protocol during bolus administration of intravenous contrast. Multiplanar CT image reconstructions and MIPs were obtained to evaluate the vascular anatomy. Carotid stenosis measurements (when applicable) are obtained utilizing NASCET criteria, using the distal internal carotid diameter as the denominator. CONTRAST:  100 mL Isovue 370 IV. 2 injections of 50 mL contrast were performed. The first attempt had suboptimal arterial contrast due to late timing of the scan. New IV was started the right arm and a repeat study was performed which is diagnostic. COMPARISON:  CT head 01/19/2015 FINDINGS: CT HEAD Brain: Ventricle size normal. Negative for acute or chronic infarction. Negative for intracranial hemorrhage or mass. Calvarium and skull base: Negative Paranasal sinuses: Negative Orbits: Negative CTA NECK Aortic arch: Normal aortic arch. No atherosclerotic disease or calcification. Proximal great vessels widely patent. Right carotid system: Normal right carotid. No evidence of atherosclerotic disease or dissection. No significant stenosis Left carotid system: Normal left carotid. Negative for atherosclerotic disease or  dissection. No significant stenosis Vertebral arteries:Normal vertebral arteries bilaterally without stenosis or dissection. Skeleton: Multiple dental caries and periapical lucencies around teeth. This is most prominent right lower third molar due to infection. Other neck: Negative for mass or adenopathy in the neck. Mild apical emphysema with subpleural cysts bilaterally. CTA HEAD Anterior circulation: The right cavernous carotid artery is widely patent without stenosis. Right posterior communicating artery patent. Right anterior and middle cerebral arteries widely patent without stenosis. Left cavernous carotid artery widely patent. Left posterior communicating artery patent. No aneurysm. Left anterior and middle cerebral arteries widely patent. Posterior circulation: Both vertebral arteries patent to the basilar. PICA patent bilaterally. Basilar widely patent. Superior cerebellar and posterior cerebral arteries patent bilaterally. Negative for aneurysm in the posterior circulation Venous sinuses: Patent Anatomic variants: None Delayed phase: Normal enhancement on delayed imaging IMPRESSION: Normal CTA of the head and neck. No evidence of aneurysm. No dissection or stenosis. Electronically Signed   By: Franchot Gallo M.D.   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 Hypertension. Sickle cell trait. EXAM: CT ANGIOGRAPHY HEAD AND NECK TECHNIQUE: Multidetector CT imaging of the head and neck was performed using the standard protocol during bolus administration of intravenous contrast. Multiplanar  CT image reconstructions and MIPs were obtained to evaluate the vascular anatomy. Carotid stenosis measurements (when applicable) are obtained utilizing NASCET criteria, using the distal internal carotid diameter as the denominator. CONTRAST:  100 mL Isovue 370 IV. 2 injections of 50 mL contrast were performed. The first attempt had suboptimal arterial  contrast due to late timing of the scan. New IV was started the right arm and a repeat study was performed which is diagnostic. COMPARISON:  CT head 01/19/2015 FINDINGS: CT HEAD Brain: Ventricle size normal. Negative for acute or chronic infarction. Negative for intracranial hemorrhage or mass. Calvarium and skull base: Negative Paranasal sinuses: Negative Orbits: Negative CTA NECK Aortic arch: Normal aortic arch. No atherosclerotic disease or calcification. Proximal great vessels widely patent. Right carotid system: Normal right carotid. No evidence of atherosclerotic disease or dissection. No significant stenosis Left carotid system: Normal left carotid. Negative for atherosclerotic disease or dissection. No significant stenosis Vertebral arteries:Normal vertebral arteries bilaterally without stenosis or dissection. Skeleton: Multiple dental caries and periapical lucencies around teeth. This is most prominent right lower third molar due to infection. Other neck: Negative for mass or adenopathy in the neck. Mild apical emphysema with subpleural cysts bilaterally. CTA HEAD Anterior circulation: The right cavernous carotid artery is widely patent without stenosis. Right posterior communicating artery patent. Right anterior and middle cerebral arteries widely patent without stenosis. Left cavernous carotid artery widely patent. Left posterior communicating artery patent. No aneurysm. Left anterior and middle cerebral arteries widely patent. Posterior circulation: Both vertebral arteries patent to the basilar. PICA patent bilaterally. Basilar widely patent. Superior cerebellar and posterior cerebral arteries patent bilaterally. Negative for aneurysm in the posterior circulation Venous sinuses: Patent Anatomic variants: None Delayed phase: Normal enhancement on delayed imaging IMPRESSION: Normal CTA of the head and neck. No evidence of aneurysm. No dissection or stenosis. Electronically Signed   By: Franchot Gallo M.D.    On: 01/28/2016 12:46   Mr Jeri Cos OV Contrast  01/28/2016  CLINICAL DATA:  Third nerve palsy. Left eye vision change. History of neurofibromatosis and sickle cell trait EXAM: MRI HEAD AND ORBITS WITHOUT AND WITH CONTRAST TECHNIQUE: Multiplanar, multiecho pulse sequences of the brain and surrounding structures were obtained without and with intravenous contrast. Multiplanar, multiecho pulse sequences of the orbits and surrounding structures were obtained including fat saturation techniques, before and after intravenous contrast administration. CONTRAST:  83m MULTIHANCE GADOBENATE DIMEGLUMINE 529 MG/ML IV SOLN COMPARISON:  CT head 01/28/2016 FINDINGS: MRI HEAD FINDINGS Ventricle size normal.  Cerebral volume normal. Negative for acute or chronic infarction. Negative for demyelinating disease.  Cerebral white matter normal. Negative for intracranial hemorrhage.  Negative for mass or edema. Postcontrast imaging reveals normal enhancement of the brain and surrounding coverings. No enhancing mass. Leptomeningeal enhancement normal. Pituitary normal in size. Normal skullbase. Paranasal sinuses clear. MRI ORBITS FINDINGS Normal shape of the globe. Optic nerve normal in size and signal bilaterally. No enhancing mass in the optic nerve. Extraocular muscles are normal. Orbital fat is normal without infiltration or mass. Serpiginous tubular structures are present in the superior orbit bilaterally. These show enhancement postcontrast. These most likely are dilated veins. Neurofibromas considered less likely given their elongated tubular serpiginous shape. No evidence of cavernous sinus thrombosis or cavernous carotid fistula. The cavernous sinus enhances normally and symmetrically without enlargement or filling defect. No enhancing mass lesion in the orbit. IMPRESSION: Negative MRI of the brain with contrast. No evidence of infarct or mass Serpiginous tubular structures in the superior orbit bilaterally most compatible  with  dilated veins . 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 No evidence of cavernous sinus thrombosis or cavernous carotid fistula. Electronically Signed   By: Franchot Gallo M.D.   On: 01/28/2016 16:15   Mr Darnelle Catalan Wo/w Cm  01/28/2016  CLINICAL DATA:  Third nerve palsy. Left eye vision change. History of neurofibromatosis and sickle cell trait EXAM: MRI HEAD AND ORBITS WITHOUT AND WITH CONTRAST TECHNIQUE: Multiplanar, multiecho pulse sequences of the brain and surrounding structures were obtained without and with intravenous contrast. Multiplanar, multiecho pulse sequences of the orbits and surrounding structures were obtained including fat saturation techniques, before and after intravenous contrast administration. CONTRAST:  80m MULTIHANCE GADOBENATE DIMEGLUMINE 529 MG/ML IV SOLN COMPARISON:  CT head 01/28/2016 FINDINGS: MRI HEAD FINDINGS Ventricle size normal.  Cerebral volume normal. Negative for acute or chronic infarction. Negative for demyelinating disease.  Cerebral white matter normal. Negative for intracranial hemorrhage.  Negative for mass or edema. Postcontrast imaging reveals normal enhancement of the brain and surrounding coverings. No enhancing mass. Leptomeningeal enhancement normal. Pituitary normal in size. Normal skullbase. Paranasal sinuses clear. MRI ORBITS FINDINGS Normal shape of the globe. Optic nerve normal in size and signal bilaterally. No enhancing mass in the optic nerve. Extraocular muscles are normal. Orbital fat is normal without infiltration or mass. Serpiginous tubular structures are present in the superior orbit bilaterally. These show enhancement postcontrast. These most likely are dilated veins. Neurofibromas considered less likely given their elongated tubular serpiginous shape. No evidence of cavernous sinus thrombosis or cavernous carotid fistula. The cavernous sinus enhances normally and symmetrically without enlargement or filling defect. No enhancing mass lesion in the orbit.  IMPRESSION: Negative MRI of the brain with contrast. No evidence of infarct or mass Serpiginous tubular structures in the superior orbit bilaterally most compatible with dilated veins . 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 No evidence of cavernous sinus thrombosis or cavernous carotid fistula. Electronically Signed   By: CFranchot GalloM.D.   On: 01/28/2016 16:15    Assessment:  Principal Problem:   Hypertensive emergency Active Problems:   Chest pain   Hypertensive urgency   Exotropia of left eye   Cardiomyopathy ([place]   Afferent pupillary defect   NICM (nonischemic cardiomyopathy) ([place]  Plan:  1.  Mr. TDowtyhas a non-ischemic cardiomyopathy with EF 20-25%. LV filling pressures appear well compensated. BP is now controlled on his current regimen. Would be ideal to be started on ARB prior to discharge for CHF benefit. Amlodipine was necessary for BP control. On d/c, will decrease amlodipine to 5 mg daily and start losartan 50 mg daily. Continue carvedilol and aspirin. Will need TCM 7 follow-up in 5-7 days with a midlevel provider and follow-up with me afterwards.  [place].  Time Spent Directly with Patient:  15 minutes   Length of Stay:  LOS: 3 days   KPixie Casino MD, FRanken Jordan A Pediatric Rehabilitation CenterAttending Cardiologist CHannibal4/14/2017, 11:23 AM

## 2016-02-03 NOTE — Progress Notes (Signed)
Cardiology Office Note    Date:  02/05/2016   ID:  John Cain, DOB May 15, 1981, MRN [id number]  PCP:  No PCP Per Patient  Cardiologist:  Dr. Rennis Golden   Post hospital follow up  History of Present Illness:  John Cain is a 35 y.o. male with a History of sickle trait, neurofibromatosis, HTN and NICM/chronic systolic CHF who presents to clinic for posthospital follow-up.  He was recently admitted from 4/11-4/14/17. He initially presented to [facility] ED with chest pain. On arrival, vitals showed severe hypertension with BP at 173/116, HR 116 and ECG with ST, LAD, and LVH with repolarization abnormalities.He was transferred to [facility] for further evaluation and treatment.   For additional BP control, amlodipine, labetalol and chlorthalidone were added. 2D echo was performed which showed severe LV dysfunction with reduced EF down to 20-25%. LHC was recommended, however prior to cath patient complained of diplopia. Neurology was consulted. CT of head and neck was unremarkable, as was MRI. He then underwent LHC on 01/29/16 which showed normal coronaries and normal right heart pressures without evidence for primary hypertension. His meds were adjusted given his NICM. His BB was changed from labetalol to Coreg. Amlodipine was reduced to allow room for addition of ARB. He was started on losartan.  Today he presents to clinic for follow-up. No chest pain or SOB. He has had a couple cigarrettes since discharge because he gets really anxious and it helps calm him. No LE edema, or PND. He chronically sleeps on 3 pillows. No dizziness or syncope. No formal exercise. He has been watching his salt and fluid intake. He previously would drink 3-4 beers a day and would drink vodka on his days off. Now only drinking occasional wine.    Past Medical History  Diagnosis Date  . Sickle cell trait (HCC)   . Fibromatosis     Past Surgical History  Procedure Laterality Date  . Cardiac  catheterization N/A 01/29/2016    Procedure: Right/Left Heart Cath and Coronary Angiography;  Surgeon: Lennette Bihari, MD;  Location: [facility] INVASIVE CV LAB;  Service: Cardiovascular;  Laterality: N/A;    Current Medications: Outpatient Prescriptions Prior to Visit  Medication Sig Dispense Refill  . amLODipine (NORVASC) 5 MG tablet Take 1 tablet (5 mg total) by mouth daily. 30 tablet 5  . aspirin EC 81 MG EC tablet Take 1 tablet (81 mg total) by mouth daily.    . carvedilol (COREG) 12.5 MG tablet Take 1 tablet (12.5 mg total) by mouth 2 (two) times daily with a meal. 60 tablet 5  . chlorthalidone (HYGROTON) 25 MG tablet Take 1 tablet (25 mg total) by mouth daily. 30 tablet 5  . losartan (COZAAR) 50 MG tablet Take 1 tablet (50 mg total) by mouth daily. 30 tablet 5   No facility-administered medications prior to visit.     Allergies:   Bee venom   Social History   Social History  . Marital Status: Married    Spouse Name: N/A  . Number of Children: N/A  . Years of Education: N/A   Social History Main Topics  . Smoking status: Current Every Day Smoker  . Smokeless tobacco: None  . Alcohol Use: Yes     Comment: patient reports i drink on my days off, 2-3 days a week  . Drug Use: No  . Sexual Activity: Not Asked   Other Topics Concern  . None   Social History Narrative  Family History:  The patient's family history is not on file. He was adopted.  Patient was adopted  ROS:   Please see the history of present illness.    ROS All other systems reviewed and are negative.   PHYSICAL EXAM:   VS:  BP 120/90 mmHg  Pulse 64  Ht 6\' 2"  (1.88 m)  Wt 213 lb 9.6 oz (96.888 kg)  BMI 27.41 kg/m2  SpO2 99%   GEN: Well nourished, well developed, in no acute distress HEENT: normal Neck: no JVD, carotid bruits, or masses Cardiac: RRR; no murmurs, rubs, or gallops,no edema  Respiratory:  clear to auscultation bilaterally, normal work of breathing GI: soft, nontender, nondistended, +  BS MS: no deformity or atrophy Skin: warm and dry, no rash Neuro:  Alert and Oriented x 3, Strength and sensation are intact Psych: euthymic mood, full affect  Wt Readings from Last 3 Encounters:  02/05/16 213 lb 9.6 oz (96.888 kg)  01/29/16 202 lb 13.2 oz (92 kg)  01/18/15 210 lb (95.255 kg)      Studies/Labs Reviewed:   EKG:  EKG is not ordered today.   Recent Labs: 01/27/2016: B Natriuretic Peptide 205.0* 01/28/2016: TSH 1.838 01/30/2016: BUN 19; Creatinine, Ser 1.22; Hemoglobin 16.2; Platelets 121*; Potassium 3.7; Sodium 140   Lipid Panel    Component Value Date/Time   CHOL 211* 01/28/2016 0946   TRIG 86 01/28/2016 0946   HDL 79 01/28/2016 0946   CHOLHDL 2.7 01/28/2016 0946   VLDL 17 01/28/2016 0946   LDLCALC 115* 01/28/2016 0946    Additional studies/ records that were reviewed today include:   2D Echo 01/29/16 Study Conclusions - Procedure narrative: Transthoracic echocardiography. The study  was technically difficult. Intravenous contrast (Definity) was  administered. - Left ventricle: The cavity size was normal. Wall thickness was  increased in a pattern of mild LVH. Thick LV apical false  tendinae. No mural thrombus with Definity contrast. Systolic  function was severely reduced. The estimated ejection fraction  was in the range of 20% to 25%. Global hypokinesis. The study is  not technically sufficient to allow evaluation of LV diastolic  function. - Mitral valve: Mildly thickened leaflets . There was moderate  regurgitation. - Left atrium: Severely dilated at 47 ml/m2. - Right ventricle: The cavity size was mildly dilated. The  moderator band was prominent. Systolic function is reduced. - Right atrium: The atrium was mildly dilated. - Tricuspid valve: There was mild regurgitation. - Pulmonary arteries: Dilated. PA peak pressure: 29 mm Hg (S). - Inferior vena cava: The vessel was normal in size. The  respirophasic diameter changes were in the  normal range (>= 50%),  consistent with normal central venous pressure. Impressions: - Technically difficult study. LVEF 20-25%, mild LVH, severe global  hypokinesis, prominent LV false tendons, no mural thrombus,  moderate MR, mild TR, RVSP 29 mmHg, mild RAE, severe LAE.   LHC 01/29/16 Procedures    Right/Left Heart Cath and Coronary Angiography    Conclusion    Dilated nonischemic cardiomyopathy with severely dysfunction with diffuse global hypokinesis and ejection fraction of 25th 25%.  Normal coronary arteries.  Normal right heart pressures without evidence for primary hypertension.  Cardiac output by the Fick method was 5.2 L/m with a cardiac index of 2.3 L/m/m           ASSESSMENT:    1. Non-ischemic cardiomyopathy (HCC)   2. Essential hypertension   3. Tobacco abuse      PLAN:  In order  of problems listed above:  NICM/ chronic systolic CHF: Appears euvolemic. Continue carvedilol 12.5 mg twice a day and losartan 50 mg daily as well as chlorthalidone 25 mg daily. Will get a BMP today to monitor renal function and electrolytes.  HTN: Blood pressure well controlled on amlodipine 5 mg daily, Coreg 12.5 mg twice a day chlorthalidone 25 mg daily and losartan 50 mg daily.  Tobacco abuse: having difficulty quitting. He gets anxious and uses it to relax. Willing to try Wellbutrin SR 150mg  x3 days followed by 150mg  BID.    Medication Adjustments/Labs and Tests Ordered: Current medicines are reviewed at length with the patient today.  Concerns regarding medicines are outlined above.  Medication changes, Labs and Tests ordered today are listed in the Patient Instructions below. Patient Instructions  Medication Instructions:  Your physician has recommended you make the following change in your medication:  1.  START Wellbutrin SR 150 mg take 1 tablet a day X's 3 days then increase it to 1 tablet twice a day  Labwork: TODAY:  BMET  Testing/Procedures: None  ordered  Follow-Up: Your physician recommends that you schedule a follow-up appointment in: 3 MONTHS WITH DR. HILTY    Any Other Special Instructions Will Be Listed Below (If Applicable).     If you need a refill on your cardiac medications before your next appointment, please call your pharmacy.       Charlestine Massed  02/05/2016 8:34 AM    [facility] [address] [place], [place], [place]  [address] Phone: [phone]; Fax: [phone]

## 2016-02-05 ENCOUNTER — Encounter: Payer: Self-pay | Admitting: Physician Assistant

## 2016-02-05 ENCOUNTER — Ambulatory Visit (INDEPENDENT_AMBULATORY_CARE_PROVIDER_SITE_OTHER): Payer: PRIVATE HEALTH INSURANCE | Admitting: Physician Assistant

## 2016-02-05 VITALS — BP 120/90 | HR 64 | Ht 74.0 in | Wt 213.6 lb

## 2016-02-05 DIAGNOSIS — Z720 Tobacco use: Secondary | ICD-10-CM

## 2016-02-05 DIAGNOSIS — I10 Essential (primary) hypertension: Secondary | ICD-10-CM | POA: Diagnosis not present

## 2016-02-05 DIAGNOSIS — I428 Other cardiomyopathies: Secondary | ICD-10-CM

## 2016-02-05 DIAGNOSIS — I429 Cardiomyopathy, unspecified: Secondary | ICD-10-CM | POA: Diagnosis not present

## 2016-02-05 LAB — BASIC METABOLIC PANEL
BUN: 18 mg/dL (ref 7–25)
CO2: 26 mmol/L (ref 20–31)
Calcium: 9.5 mg/dL (ref 8.6–10.3)
Chloride: 101 mmol/L (ref 98–110)
Creat: 1.13 mg/dL (ref 0.60–1.35)
Glucose, Bld: 100 mg/dL — ABNORMAL HIGH (ref 65–99)
Potassium: 3.9 mmol/L (ref 3.5–5.3)
Sodium: 138 mmol/L (ref 135–146)

## 2016-02-05 MED ORDER — BUPROPION HCL ER (SR) 150 MG PO TB12: ORAL_TABLET | ORAL | Status: DC

## 2016-02-05 NOTE — Patient Instructions (Signed)
Medication Instructions:  Your physician has recommended you make the following change in your medication:  1.  START Wellbutrin SR 150 mg take 1 tablet a day X's 3 days then increase it to 1 tablet twice a day  Labwork: TODAY:  BMET  Testing/Procedures: None ordered  Follow-Up: Your physician recommends that you schedule a follow-up appointment in: 3 MONTHS WITH DR. HILTY    Any Other Special Instructions Will Be Listed Below (If Applicable).     If you need a refill on your cardiac medications before your next appointment, please call your pharmacy.

## 2016-02-27 ENCOUNTER — Ambulatory Visit: Payer: PRIVATE HEALTH INSURANCE | Admitting: Internal Medicine

## 2016-05-06 ENCOUNTER — Encounter: Payer: Self-pay | Admitting: *Deleted

## 2016-05-06 ENCOUNTER — Ambulatory Visit: Payer: PRIVATE HEALTH INSURANCE | Admitting: Internal Medicine

## 2016-09-27 ENCOUNTER — Emergency Department (HOSPITAL_COMMUNITY): Payer: PRIVATE HEALTH INSURANCE

## 2016-09-27 ENCOUNTER — Encounter (HOSPITAL_COMMUNITY): Payer: Self-pay | Admitting: Emergency Medicine

## 2016-09-27 ENCOUNTER — Emergency Department (HOSPITAL_COMMUNITY)
Admission: EM | Admit: 2016-09-27 | Discharge: 2016-09-27 | Disposition: A | Discharge status: Home / Self Care | Payer: PRIVATE HEALTH INSURANCE | Attending: Emergency Medicine | Admitting: Emergency Medicine

## 2016-09-27 DIAGNOSIS — Z720 Tobacco use: Secondary | ICD-10-CM

## 2016-09-27 DIAGNOSIS — H4902 Third [oculomotor] nerve palsy, left eye: Secondary | ICD-10-CM | POA: Insufficient documentation

## 2016-09-27 DIAGNOSIS — F17210 Nicotine dependence, cigarettes, uncomplicated: Secondary | ICD-10-CM | POA: Insufficient documentation

## 2016-09-27 DIAGNOSIS — R0789 Other chest pain: Secondary | ICD-10-CM | POA: Insufficient documentation

## 2016-09-27 DIAGNOSIS — Z79899 Other long term (current) drug therapy: Secondary | ICD-10-CM | POA: Insufficient documentation

## 2016-09-27 DIAGNOSIS — Z7982 Long term (current) use of aspirin: Secondary | ICD-10-CM | POA: Insufficient documentation

## 2016-09-27 DIAGNOSIS — Z9114 Patient's other noncompliance with medication regimen: Secondary | ICD-10-CM

## 2016-09-27 DIAGNOSIS — I10 Essential (primary) hypertension: Secondary | ICD-10-CM | POA: Insufficient documentation

## 2016-09-27 DIAGNOSIS — Z791 Long term (current) use of non-steroidal anti-inflammatories (NSAID): Secondary | ICD-10-CM | POA: Insufficient documentation

## 2016-09-27 HISTORY — DX: Essential (primary) hypertension: I10

## 2016-09-27 LAB — CBC
HCT: 49.7 % (ref 39.0–52.0)
Hemoglobin: 17.6 g/dL — ABNORMAL HIGH (ref 13.0–17.0)
MCH: 31.8 pg (ref 26.0–34.0)
MCHC: 35.4 g/dL (ref 30.0–36.0)
MCV: 89.9 fL (ref 78.0–100.0)
Platelets: 135 10*3/uL — ABNORMAL LOW (ref 150–400)
RBC: 5.53 MIL/uL (ref 4.22–5.81)
RDW: 13.2 % (ref 11.5–15.5)
WBC: 5.8 10*3/uL (ref 4.0–10.5)

## 2016-09-27 LAB — BASIC METABOLIC PANEL
Anion gap: 7 (ref 5–15)
BUN: 11 mg/dL (ref 6–20)
CO2: 25 mmol/L (ref 22–32)
Calcium: 9.4 mg/dL (ref 8.9–10.3)
Chloride: 108 mmol/L (ref 101–111)
Creatinine, Ser: 1.01 mg/dL (ref 0.61–1.24)
GFR calc Af Amer: >60 mL/min (ref >60)
GFR calc non Af Amer: >60 mL/min (ref >60)
Glucose, Bld: 100 mg/dL — ABNORMAL HIGH (ref 65–99)
Potassium: 4.3 mmol/L (ref 3.5–5.1)
Sodium: 140 mmol/L (ref 135–145)

## 2016-09-27 LAB — TROPONIN I
Troponin I: <0.03 ng/mL (ref <0.03)
Troponin I: <0.03 ng/mL (ref <0.03)

## 2016-09-27 MED ORDER — ASPIRIN 81 MG PO TBEC: 81.0000 mg | DELAYED_RELEASE_TABLET | Freq: Every day | ORAL | 0 refills | Status: AC

## 2016-09-27 MED ORDER — HYDRALAZINE HCL 20 MG/ML IJ SOLN
20.0000 mg | Freq: Once | INTRAMUSCULAR | Status: AC
  Administered 2016-09-27: 20 mg via INTRAVENOUS
  Filled 2016-09-27: qty 1

## 2016-09-27 MED ORDER — AMLODIPINE BESYLATE 5 MG PO TABS: 5.0000 mg | ORAL_TABLET | Freq: Every day | ORAL | 5 refills | Status: DC

## 2016-09-27 MED ORDER — CARVEDILOL 12.5 MG PO TABS: 12.5000 mg | ORAL_TABLET | Freq: Two times a day (BID) | ORAL | 5 refills | Status: DC

## 2016-09-27 MED ORDER — LABETALOL HCL 5 MG/ML IV SOLN
20.0000 mg | Freq: Once | INTRAVENOUS | Status: AC
  Administered 2016-09-27: 20 mg via INTRAVENOUS
  Filled 2016-09-27: qty 4

## 2016-09-27 MED ORDER — CHLORTHALIDONE 25 MG PO TABS: 25.0000 mg | ORAL_TABLET | Freq: Every day | ORAL | 5 refills | Status: AC

## 2016-09-27 MED ORDER — LOSARTAN POTASSIUM 50 MG PO TABS: 50.0000 mg | ORAL_TABLET | Freq: Every day | ORAL | 5 refills | Status: DC

## 2016-09-27 NOTE — ED Provider Notes (Signed)
[facility]-EMERGENCY DEPT Provider Note   CSN: [id number] Arrival date & time: 09/27/16  1212     History   Chief Complaint Chief Complaint  Patient presents with  . Chest Pain    HPI John Cain is a 35 y.o. male.  Pt presents to the ED today with CP.  Pt said that it started when he woke up.  The pt was [facility] in April for HTN emergency and CHF.  He had a heart cath on 01/29/16 which showed normal coronary arteries, but a dilated nonischemic cardiomyopathy.  He was placed on amlodipine, chlorthalidone, coreg, and cozaar for his blood pressure and CHF management.  While in the hospital, the pt also noted that he can't see out of his left eye.  Neurology was consulted and CTA and MRI negative.  He was referred to ophthalmology.  Unfortunately, the pt lost his insurance shortly after this hospitalization.  He has not had any follow up since April for his heart.  He has not seen ophthalmology.  Pt has been out of his medications for at least 2 weeks.  He denies any sob.      Past Medical History:  Diagnosis Date  . Fibromatosis   . Hypertension   . Sickle cell trait [facility](HCC)     Patient Active Problem List   Diagnosis Date Noted  . Cardiomyopathy (HCC) 01/29/2016  . Afferent pupillary defect   . NICM (nonischemic cardiomyopathy) (HCC)   . Exotropia of left eye 01/28/2016  . Chest pain 01/27/2016  . Hypertensive emergency 01/27/2016  . Hypertensive urgency 01/27/2016    Past Surgical History:  Procedure Laterality Date  . CARDIAC CATHETERIZATION N/A 01/29/2016   Procedure: Right/Left Heart Cath and Coronary Angiography;  Surgeon: Lennette Biharihomas A Kelly, MD;  [facility];  Service: Cardiovascular;  Laterality: N/A;       Home Medications    Prior to Admission medications   Medication Sig Start Date End Date Taking? Authorizing Provider  amLODipine (NORVASC) 5 MG tablet Take 1 tablet (5 mg total) by mouth daily. 09/27/16   Jacalyn LefevreJulie Chael Urenda, MD  aspirin 81  MG EC tablet Take 1 tablet (81 mg total) by mouth daily. 09/27/16   Jacalyn LefevreJulie Carmalita Wakefield, MD  buPROPion (WELLBUTRIN SR) 150 MG 12 hr tablet TAKE 1 TABLET BY MOUTH DAILY FOR 3 DAYS THEN TAKE 2 TABLETS BY MOUTH DAILY 02/05/16   Janetta HoraKathryn R Thompson, PA-C  carvedilol (COREG) 12.5 MG tablet Take 1 tablet (12.5 mg total) by mouth 2 (two) times daily with a meal. 09/27/16   Jacalyn LefevreJulie Jacquelynn Friend, MD  chlorthalidone (HYGROTON) 25 MG tablet Take 1 tablet (25 mg total) by mouth daily. 09/27/16   Jacalyn LefevreJulie Quetzali Heinle, MD  losartan (COZAAR) 50 MG tablet Take 1 tablet (50 mg total) by mouth daily. 09/27/16   Jacalyn LefevreJulie Aune Adami, MD    Family History Family History  Problem Relation Age of Onset  . Adopted: Yes    Social History Social History  Substance Use Topics  . Smoking status: Current Every Day Smoker    Packs/day: 0.50    Types: Cigarettes  . Smokeless tobacco: Never Used  . Alcohol use Yes     Comment: daily 2-3 beers     Allergies   Bee venom   Review of Systems Review of Systems  Eyes: Positive for visual disturbance.  Cardiovascular: Positive for chest pain.  All other systems reviewed and are negative.    Physical Exam Updated Vital Signs BP (!) 177/119  Pulse 61   Temp 97.9 F (36.6 C) (Oral)   Resp 25   Ht 6\' 2"  (1.88 m)   Wt 220 lb (99.8 kg)   SpO2 99%   BMI 28.25 kg/m   Physical Exam  Constitutional: He is oriented to person, place, and time. He appears well-developed and well-nourished.  HENT:  Head: Normocephalic and atraumatic.  Right Ear: External ear normal.  Left Ear: External ear normal.  Nose: Nose normal.  Mouth/Throat: Oropharynx is clear and moist.  Eyes: Conjunctivae are normal.  3rd nerve palsy left eye.  Extropia left eye.  Very poor vision left eye (sees blurry images).  All chronic since April  Neck: Normal range of motion. Neck supple.  Cardiovascular: Normal rate, regular rhythm, normal heart sounds and intact distal pulses.   Pulmonary/Chest: Effort normal  and breath sounds normal.  Abdominal: Soft. Bowel sounds are normal.  Musculoskeletal: Normal range of motion.  Neurological: He is alert and oriented to person, place, and time.  Skin: Skin is warm.  Scattered neurofibromas noted  Psychiatric: He has a normal mood and affect. His behavior is normal. Judgment and thought content normal.  Nursing note and vitals reviewed.    ED Treatments / Results  Labs (all labs ordered are listed, but only abnormal results are displayed) Labs Reviewed  CBC - Abnormal; Notable for the following:       Result Value   Hemoglobin 17.6 (*)    Platelets 135 (*)    All other components within normal limits  BASIC METABOLIC PANEL - Abnormal; Notable for the following:    Glucose, Bld 100 (*)    All other components within normal limits  TROPONIN I  TROPONIN I    EKG  EKG Interpretation  Date/Time:  Monday September 27 2016 12:14:55 EST Ventricular Rate:  80 PR Interval:  168 QRS Duration: 106 QT Interval:  388 QTC Calculation: 447 R Axis:   -25 Text Interpretation:  Normal sinus rhythm Possible Left atrial enlargement Left ventricular hypertrophy T wave abnormality, consider lateral ischemia Abnormal ECG No significant change since last tracing Confirmed by [facility] MD, Hines Kloss ([id number]) on 09/27/2016 3:05:57 PM       Radiology Dg Chest 2 View  Result Date: 09/27/2016 CLINICAL DATA:  Acute onset chest pain today. EXAM: CHEST  2 VIEW COMPARISON:  01/27/2016 FINDINGS: The heart size and mediastinal contours are within normal limits. Both lungs are clear. The visualized skeletal structures are unremarkable. IMPRESSION: No active cardiopulmonary disease. Electronically Signed   By: Myles Rosenthal M.D.   On: 09/27/2016 12:41    Procedures Procedures (including critical care time)  Medications Ordered in ED Medications  labetalol (NORMODYNE,TRANDATE) injection 20 mg (20 mg Intravenous Given 09/27/16 1550)  hydrALAZINE (APRESOLINE) injection 20 mg  (20 mg Intravenous Given 09/27/16 1630)     Initial Impression / Assessment and Plan / ED Course  I have reviewed the triage vital signs and the nursing notes.  Pertinent labs & imaging results that were available during my care of the patient were reviewed by me and considered in my medical decision making (see chart for details).  Clinical Course     Meds refilled.  He was given the number of unassigned medicine to f/u.  He is encouraged to not smoke and eat a low salt diet.  He remains hypertensive, but as this is chronic and he has not been taking his meds, I don't think I need to get it to normal.  The outpatient meds  will do that.  Final Clinical Impressions(s) / ED Diagnoses   Final diagnoses:  Essential hypertension  Atypical chest pain  Tobacco abuse  Noncompliance with medication regimen  Third nerve palsy, left    New Prescriptions Current Discharge Medication List       Jacalyn Lefevre, MD 09/27/16 1721

## 2016-09-27 NOTE — Discharge Instructions (Signed)
Do not smoke. °

## 2016-09-27 NOTE — ED Notes (Signed)
EDP notified of pt's bp  with no new orders  

## 2016-12-06 ENCOUNTER — Emergency Department (HOSPITAL_COMMUNITY)
Admission: EM | Admit: 2016-12-06 | Discharge: 2016-12-06 | Disposition: A | Discharge status: Home / Self Care | Payer: Self-pay | Attending: Emergency Medicine | Admitting: Emergency Medicine

## 2016-12-06 ENCOUNTER — Encounter (HOSPITAL_COMMUNITY): Payer: Self-pay | Admitting: Emergency Medicine

## 2016-12-06 ENCOUNTER — Emergency Department (HOSPITAL_COMMUNITY): Payer: Self-pay

## 2016-12-06 DIAGNOSIS — I10 Essential (primary) hypertension: Secondary | ICD-10-CM | POA: Insufficient documentation

## 2016-12-06 DIAGNOSIS — F17210 Nicotine dependence, cigarettes, uncomplicated: Secondary | ICD-10-CM | POA: Insufficient documentation

## 2016-12-06 DIAGNOSIS — Z79899 Other long term (current) drug therapy: Secondary | ICD-10-CM | POA: Insufficient documentation

## 2016-12-06 DIAGNOSIS — I429 Cardiomyopathy, unspecified: Secondary | ICD-10-CM | POA: Insufficient documentation

## 2016-12-06 DIAGNOSIS — Z7982 Long term (current) use of aspirin: Secondary | ICD-10-CM | POA: Insufficient documentation

## 2016-12-06 LAB — BASIC METABOLIC PANEL
Anion gap: 10 (ref 5–15)
BUN: 12 mg/dL (ref 6–20)
CO2: 27 mmol/L (ref 22–32)
Calcium: 9.4 mg/dL (ref 8.9–10.3)
Chloride: 100 mmol/L — ABNORMAL LOW (ref 101–111)
Creatinine, Ser: 0.95 mg/dL (ref 0.61–1.24)
GFR calc Af Amer: >60 mL/min (ref >60)
GFR calc non Af Amer: >60 mL/min (ref >60)
Glucose, Bld: 112 mg/dL — ABNORMAL HIGH (ref 65–99)
Potassium: 3.5 mmol/L (ref 3.5–5.1)
Sodium: 137 mmol/L (ref 135–145)

## 2016-12-06 LAB — BRAIN NATRIURETIC PEPTIDE: B Natriuretic Peptide: 10 pg/mL (ref 0.0–100.0)

## 2016-12-06 LAB — CBC WITH DIFFERENTIAL/PLATELET
Basophils Absolute: 0 10*3/uL (ref 0.0–0.1)
Basophils Relative: 1 %
Eosinophils Absolute: 0.1 10*3/uL (ref 0.0–0.7)
Eosinophils Relative: 2 %
HCT: 48.8 % (ref 39.0–52.0)
Hemoglobin: 17.7 g/dL — ABNORMAL HIGH (ref 13.0–17.0)
Lymphocytes Relative: 21 %
Lymphs Abs: 1 10*3/uL (ref 0.7–4.0)
MCH: 31.6 pg (ref 26.0–34.0)
MCHC: 36.3 g/dL — ABNORMAL HIGH (ref 30.0–36.0)
MCV: 87 fL (ref 78.0–100.0)
Monocytes Absolute: 0.9 10*3/uL (ref 0.1–1.0)
Monocytes Relative: 18 %
Neutro Abs: 2.7 10*3/uL (ref 1.7–7.7)
Neutrophils Relative %: 58 %
Platelets: 126 10*3/uL — ABNORMAL LOW (ref 150–400)
RBC: 5.61 MIL/uL (ref 4.22–5.81)
RDW: 13.4 % (ref 11.5–15.5)
WBC: 4.7 10*3/uL (ref 4.0–10.5)

## 2016-12-06 LAB — TROPONIN I: Troponin I: <0.03 ng/mL (ref <0.03)

## 2016-12-06 MED ORDER — CARVEDILOL 12.5 MG PO TABS
12.5000 mg | ORAL_TABLET | Freq: Two times a day (BID) | ORAL | Status: DC
  Filled 2016-12-06: qty 1

## 2016-12-06 MED ORDER — CARVEDILOL 12.5 MG PO TABS
12.5000 mg | ORAL_TABLET | Freq: Once | ORAL | Status: AC
  Administered 2016-12-06: 12.5 mg via ORAL

## 2016-12-06 MED ORDER — AMLODIPINE BESYLATE 5 MG PO TABS
5.0000 mg | ORAL_TABLET | Freq: Once | ORAL | Status: AC
  Administered 2016-12-06: 5 mg via ORAL
  Filled 2016-12-06: qty 1

## 2016-12-06 NOTE — ED Provider Notes (Signed)
[facility]-EMERGENCY DEPT Provider Note   CSN: [id number] Arrival date & time: 12/06/16  1517     History   Chief Complaint Chief Complaint  Patient presents with  . Hypertension    HPI John Cain is a 36 y.o. male.  HPI 36 year old male with history of hypertension and nonischemic dilated cardiomyopathy who presents with chest pressure and dizziness. States that he woke up this morning having a mild headache that was generalized with chest pressure. He went to [facility] and checked his blood pressure. It was 172/126. He was subsequently directed to the ED for evaluation. Patient was admitted in April 2017 for hypertensive urgency. He had a left heart catheterization at that time that showed no coronary artery disease. Echo did show dilated nonischemic cardiomyopathy with EF of 20%. He was placed on losartan, amlodipine, carvedilol, and chlorthalidone. States that he has been compliant with these medications, but did briefly run out of medications back in December which were refilled in the ED. He has not had any recent illnesses, leg swelling, leg pain, orthopnea or PND, confusion, focal numbness or weakness. He states he has been urinating normally. Past Medical History:  Diagnosis Date  . Fibromatosis   . Hypertension   . Sickle cell trait [facility])     Patient Active Problem List   Diagnosis Date Noted  . Cardiomyopathy (HCC) 01/29/2016  . Afferent pupillary defect   . NICM (nonischemic cardiomyopathy) (HCC)   . Exotropia of left eye 01/28/2016  . Chest pain 01/27/2016  . Hypertensive emergency 01/27/2016  . Hypertensive urgency 01/27/2016    Past Surgical History:  Procedure Laterality Date  . CARDIAC CATHETERIZATION N/A 01/29/2016   Procedure: Right/Left Heart Cath and Coronary Angiography;  Surgeon: Lennette Bihari, MD;  Location: [facility] INVASIVE CV LAB;  Service: Cardiovascular;  Laterality: N/A;       Home Medications    Prior to Admission medications   Medication Sig  Start Date End Date Taking? Authorizing Provider  amLODipine (NORVASC) 5 MG tablet Take 1 tablet (5 mg total) by mouth daily. 09/27/16  Yes Jacalyn Lefevre, MD  buPROPion (WELLBUTRIN SR) 150 MG 12 hr tablet TAKE 1 TABLET BY MOUTH DAILY FOR 3 DAYS THEN TAKE 2 TABLETS BY MOUTH DAILY 02/05/16  Yes Janetta Hora, PA-C  carvedilol (COREG) 12.5 MG tablet Take 1 tablet (12.5 mg total) by mouth 2 (two) times daily with a meal. 09/27/16  Yes Jacalyn Lefevre, MD  chlorthalidone (HYGROTON) 25 MG tablet Take 1 tablet (25 mg total) by mouth daily. 09/27/16  Yes Jacalyn Lefevre, MD  losartan (COZAAR) 50 MG tablet Take 1 tablet (50 mg total) by mouth daily. 09/27/16  Yes Jacalyn Lefevre, MD  aspirin 81 MG EC tablet Take 1 tablet (81 mg total) by mouth daily. Patient not taking: Reported on 12/06/2016 09/27/16   Jacalyn Lefevre, MD    Family History Family History  Problem Relation Age of Onset  . Adopted: Yes    Social History Social History  Substance Use Topics  . Smoking status: Current Every Day Smoker    Packs/day: 0.50    Types: Cigarettes  . Smokeless tobacco: Never Used  . Alcohol use Yes     Comment: daily 2-3 beers     Allergies   Bee venom   Review of Systems Review of Systems 10/14 systems reviewed and are negative other than those stated in the HPI   Physical Exam Updated Vital Signs BP (!) 151/104 (BP Location: Right Arm)   Pulse  63   Temp 98.7 F (37.1 C) (Oral)   Resp 18   Ht 6\' 2"  (1.88 m)   Wt 220 lb (99.8 kg)   SpO2 100%   BMI 28.25 kg/m   Physical Exam Physical Exam  Nursing note and vitals reviewed. Constitutional: Well developed, well nourished, non-toxic, and in no acute distress Head: Normocephalic and atraumatic.  Mouth/Throat: Oropharynx is clear and moist.  Neck: Normal range of motion. Neck supple.  Cardiovascular: Normal rate and regular rhythm.   Pulmonary/Chest: Effort normal and breath sounds normal.  Abdominal: Soft. There is no tenderness.  There is no rebound and no guarding.  Musculoskeletal: Normal range of motion.  Neurological: Alert, no facial droop, fluent speech, moves all extremities symmetrically, PERRL Skin: Skin is warm and dry.  Psychiatric: Cooperative   ED Treatments / Results  Labs (all labs ordered are listed, but only abnormal results are displayed) Labs Reviewed  CBC WITH DIFFERENTIAL/PLATELET - Abnormal; Notable for the following:       Result Value   Hemoglobin 17.7 (*)    MCHC 36.3 (*)    Platelets 126 (*)    All other components within normal limits  BASIC METABOLIC PANEL - Abnormal; Notable for the following:    Chloride 100 (*)    Glucose, Bld 112 (*)    All other components within normal limits  TROPONIN I  BRAIN NATRIURETIC PEPTIDE    EKG  EKG Interpretation  Date/Time:  Monday December 06 2016 16:24:22 EST Ventricular Rate:  82 PR Interval:  168 QRS Duration: 102 QT Interval:  398 QTC Calculation: 464 R Axis:   -28 Text Interpretation:  Normal sinus rhythm Possible Left atrial enlargement Left ventricular hypertrophy Abnormal ECG no significant changes  Confirmed by Caleel Kiner MD, Asar Evilsizer [phone]) on 12/06/2016 4:30:57 PM       Radiology Dg Chest 2 View  Result Date: 12/06/2016 CLINICAL DATA:  Chest pain and dizziness. History of sickle cell trait, hypertension, cardiomyopathy, current smoker. EXAM: CHEST  2 VIEW COMPARISON:  PA and lateral chest x-ray of September 27, 2016 FINDINGS: The lungs are well-expanded. There is no focal infiltrate. There is no pleural effusion. The heart and pulmonary vascularity are normal. The mediastinum is normal in width. The trachea is midline. There is chronic lateral rib deformity on the left. IMPRESSION: There is no pneumonia, CHF, nor other acute cardiopulmonary abnormality. Electronically Signed   By: David  [place] M.D.   On: 12/06/2016 16:44    Procedures Procedures (including critical care time)  Medications Ordered in ED Medications  amLODipine  (NORVASC) tablet 5 mg (5 mg Oral Given 12/06/16 1954)  carvedilol (COREG) tablet 12.5 mg (12.5 mg Oral Given 12/06/16 1956)     Initial Impression / Assessment and Plan / ED Course  I have reviewed the triage vital signs and the nursing notes.  Pertinent labs & imaging results that were available during my care of the patient were reviewed by me and considered in my medical decision making (see chart for details).     Presenting with elevated blood pressures at home. States he is compliant. BP 170/120s in ED. Given evening dose of coreg and additional dose amlodipine with improvement in BP. Has not had f/u with cardiology, and next appt next month. Did breifly speak with cardiology Dr. Tiburcio Pea. Recommending close outpatient follow-up.  There is no signs of end organ damage today. He is otherwise well-appearing, has a nonfocal exam. Feels better after blood pressure improved. Is felt to be stable  for discharge home. Strict return and follow-up instructions reviewed. He expressed understanding of all discharge instructions and felt comfortable with the plan of care.   Final Clinical Impressions(s) / ED Diagnoses   Final diagnoses:  Essential hypertension    New Prescriptions New Prescriptions   No medications on file     Lavera Guise, MD 12/06/16 2115

## 2016-12-06 NOTE — Discharge Instructions (Signed)
Our cardiologist recommend that you follow-up with cardiology closely. Please call the office to see if you can have a closer appointment. Continue to take your blood pressure medications as prescribed. If you have elevated blood pressures still, you can take an extra dose of the amlodipine 5 mg tablet. Please return without fail for worsening symptoms, including difficulty breathing, worsening pain, passing out, or any other symptoms concerning to you.

## 2016-12-06 NOTE — ED Triage Notes (Signed)
Patient c/o hypertension. Per patient woke this morning with headache and some chest pain. Patient went to [facility] and had blood pressure checked. 172/126. Patient states has been taken losartan, amlodipine, carvedilol and chlorthalidone x5-6 months and has appointment with PCP on 3/15. Denies any dizziness.

## 2017-04-20 ENCOUNTER — Emergency Department (HOSPITAL_COMMUNITY)
Admission: EM | Admit: 2017-04-20 | Discharge: 2017-04-20 | Discharge status: Against Medical Advice | Payer: Self-pay | Attending: Emergency Medicine | Admitting: Emergency Medicine

## 2017-04-20 ENCOUNTER — Encounter (HOSPITAL_COMMUNITY): Payer: Self-pay | Admitting: *Deleted

## 2017-04-20 DIAGNOSIS — I10 Essential (primary) hypertension: Secondary | ICD-10-CM | POA: Insufficient documentation

## 2017-04-20 DIAGNOSIS — Z5321 Procedure and treatment not carried out due to patient leaving prior to being seen by health care provider: Secondary | ICD-10-CM | POA: Insufficient documentation

## 2017-04-20 NOTE — ED Notes (Signed)
Pt not in waiting room x 1 

## 2017-04-20 NOTE — ED Notes (Signed)
Pt called x 3 no answer in the waiting area

## 2017-04-20 NOTE — ED Notes (Signed)
Pt called no answer x2 

## 2017-04-20 NOTE — ED Triage Notes (Signed)
States he wasn't feeling right so he came in to have his blood pressure checked, denies any pain

## 2017-05-06 IMAGING — MR MR HEAD WO/W CM
12 of 18 series · 29 of 48 positions shown · IV contrast (Yes   MULTIHANCE)
Comparison: CT head 01/28/2016

CLINICAL DATA: Third nerve palsy. Left eye vision change. History
of neurofibromatosis and sickle cell trait

EXAM:
MRI HEAD AND ORBITS WITHOUT AND WITH CONTRAST
TECHNIQUE: Multiplanar, multiecho pulse sequences of the brain and surrounding
structures were obtained without and with intravenous contrast.
Multiplanar, multiecho pulse sequences of the orbits and surrounding
structures were obtained including fat saturation techniques, before
and after intravenous contrast administration.
CONTRAST:  20mL MULTIHANCE GADOBENATE DIMEGLUMINE 529 MG/ML IV SOLN

[Series 4: DWI · axial · 3.0mm · 1.09mm/px · z∈[-61,+86]mm · 7 of 100 slices shown (1 of 4)]
[im 1/100]
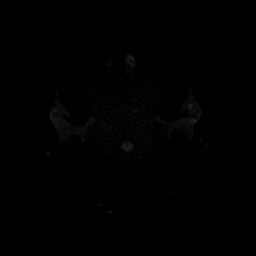
[im 17/100]
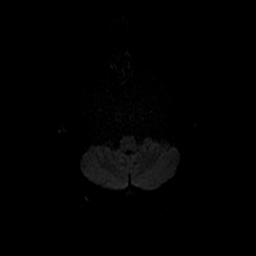
[im 34/100]
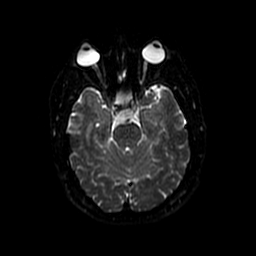
[im 50/100]
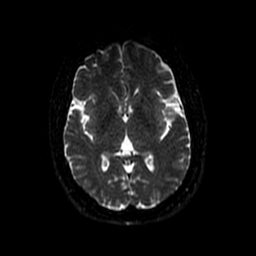
[im 67/100]
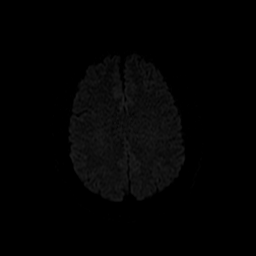
[im 83/100]
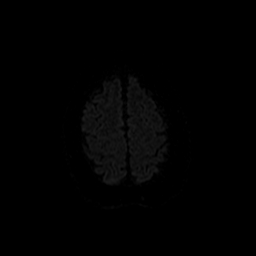
[im 100/100]
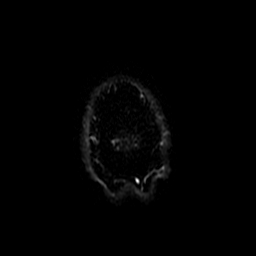

[Series 5: T2 · axial · 5.0mm · 0.47mm/px · z∈[-53,+103]mm · 2 of 27 slices shown (1 of 2)]
[im 1/27]
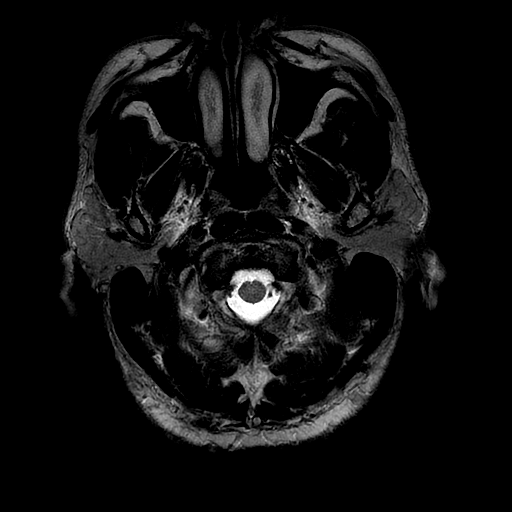
[im 27/27]
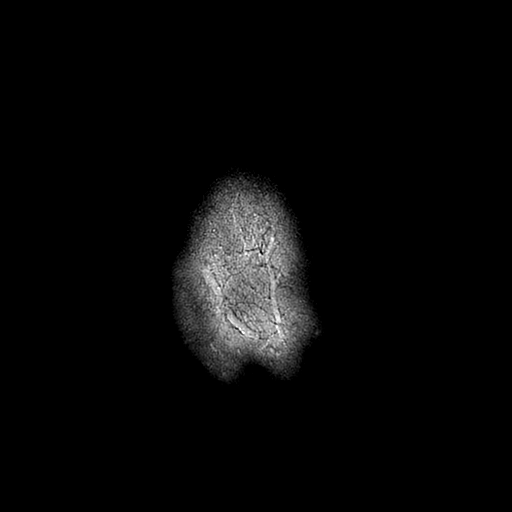

[Series 7: T2 · coronal · 5.0mm · 0.43mm/px · 2 of 30 slices shown (2 of 2)]
[im 1/30]
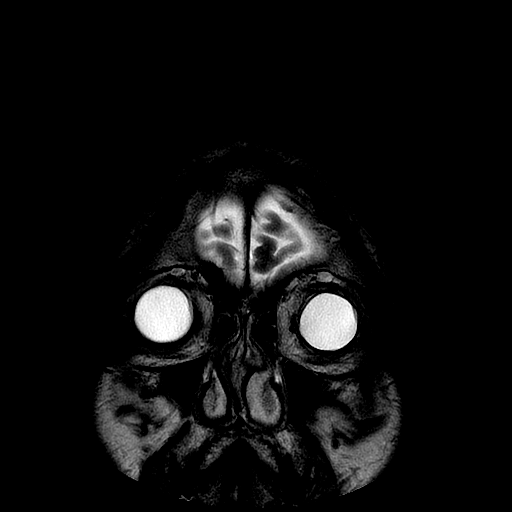
[im 30/30]
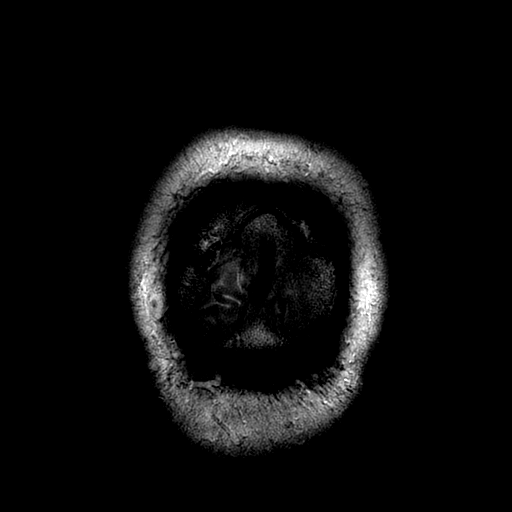

[Series 8: FLAIR · axial · 5.0mm · 0.47mm/px · z∈[-54,+102]mm · 2 of 27 slices shown]
[im 1/27]
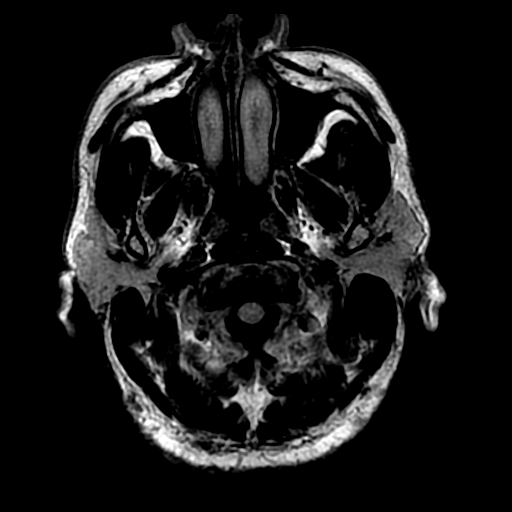
[im 27/27]
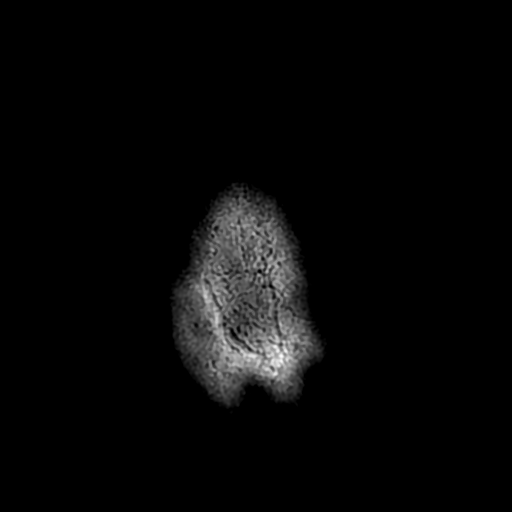

[Series 9: DWI · coronal · 5.0mm · 0.86mm/px · 4 of 60 slices shown (2 of 4)]
[im 1/60]
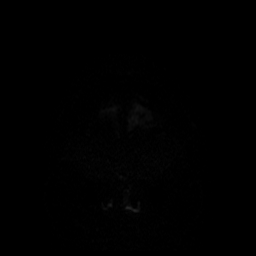
[im 20/60]
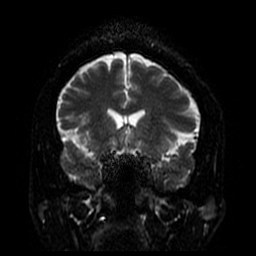
[im 40/60]
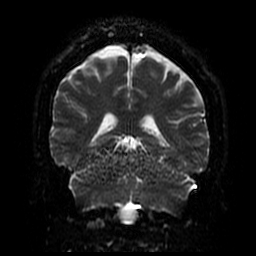
[im 60/60]
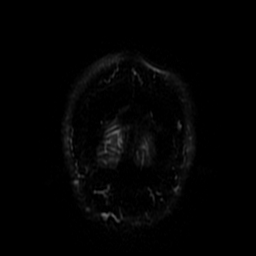

[Series 14: T2 fat-sat · axial · 3.0mm · 0.35mm/px · 1 of 18 slices shown (1 of 2)]
[im 1/18]
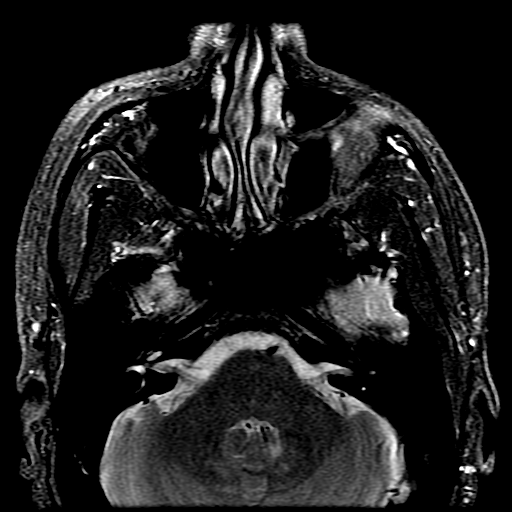

[Series 15: T2 fat-sat · coronal · 3.0mm · 0.35mm/px · 1 of 28 slices shown (2 of 2)]
[im 1/28]
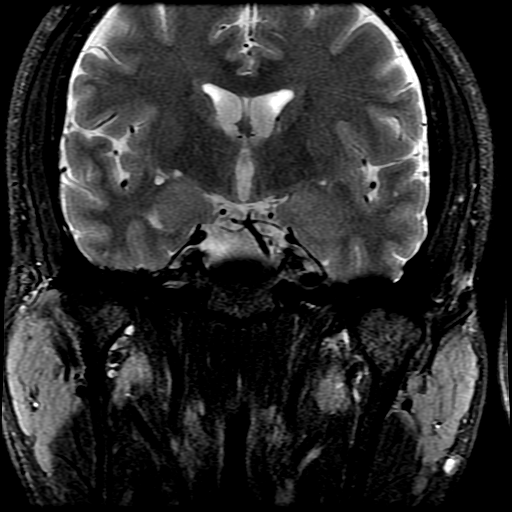

[Series 16: T1 post-contrast · axial · 3.0mm · 0.35mm/px · 1 of 18 slices shown (1 of 3)]
[im 1/18]
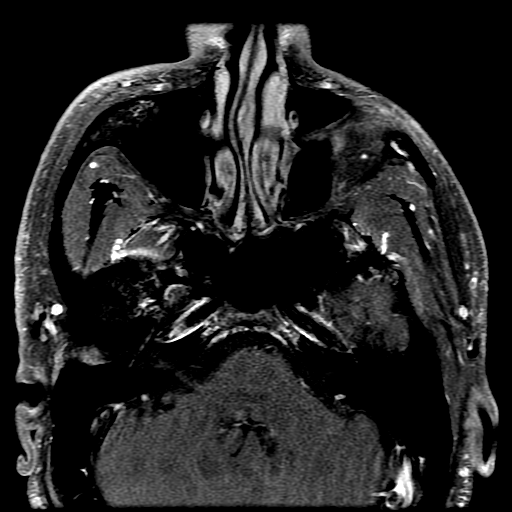

[Series 17: T1 post-contrast · coronal · 3.0mm · 0.35mm/px · 2 of 28 slices shown (2 of 3)]
[im 1/28]
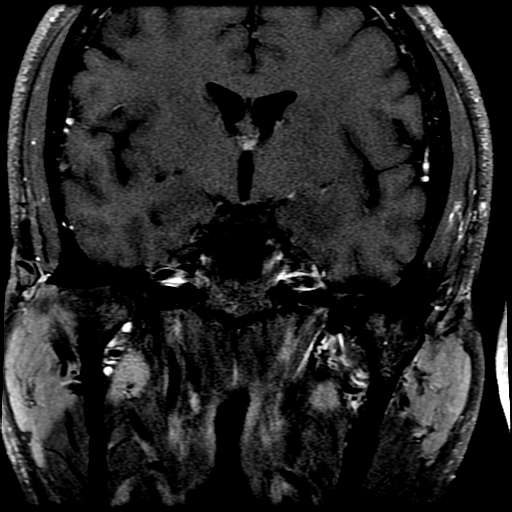
[im 28/28]
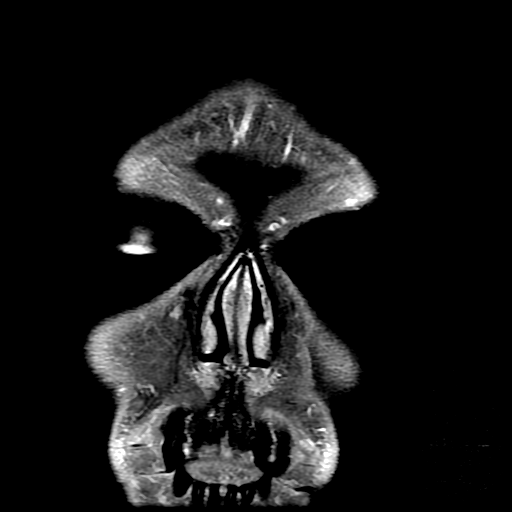

[Series 18: T1 post-contrast · coronal · 5.0mm · 0.43mm/px · 2 of 30 slices shown (3 of 3)]
[im 1/30]
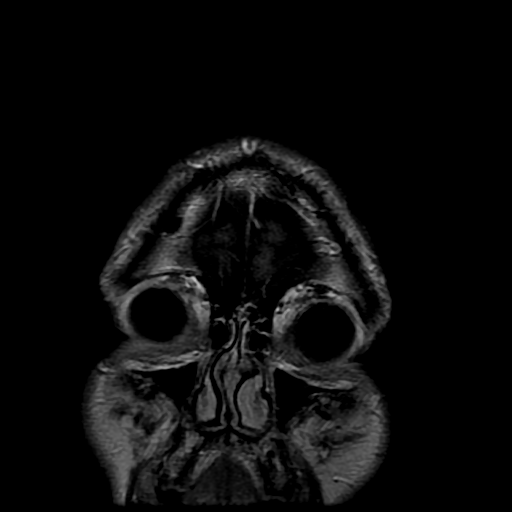
[im 30/30]
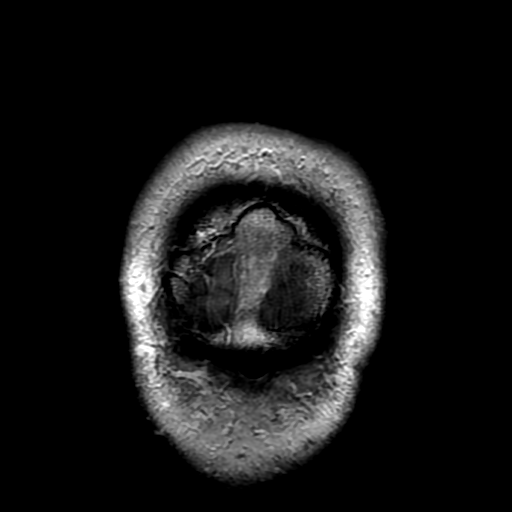

[Series 400: DWI · axial · 3.0mm · 1.09mm/px · z∈[-61,+86]mm · 3 of 50 slices shown (3 of 4)]
[im 1/50]
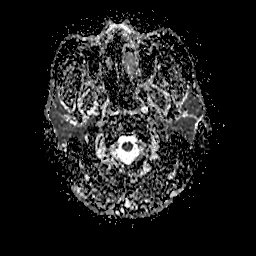
[im 25/50]
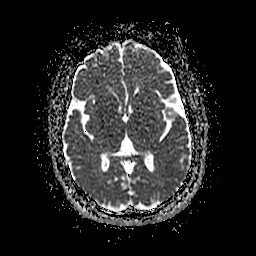
[im 50/50]
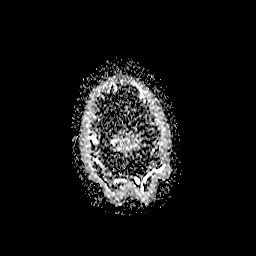

[Series 900: DWI · coronal · 5.0mm · 0.86mm/px · 2 of 30 slices shown (4 of 4)]
[im 1/30]
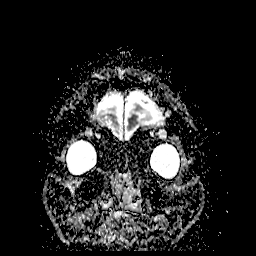
[im 30/30]
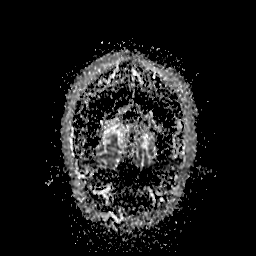

[29 of 48 positions shown; findings below may reference images not displayed]

FINDINGS: MRI HEAD FINDINGS

Ventricle size normal.  Cerebral volume normal.

Negative for acute or chronic infarction.

Negative for demyelinating disease.  Cerebral white matter normal.

Negative for intracranial hemorrhage.  Negative for mass or edema.

Postcontrast imaging reveals normal enhancement of the brain and
surrounding coverings. No enhancing mass. Leptomeningeal enhancement
normal.

Pituitary normal in size. Normal skullbase. Paranasal sinuses clear.

MRI ORBITS FINDINGS

Normal shape of the globe. Optic nerve normal in size and signal
bilaterally. No enhancing mass in the optic nerve. Extraocular
muscles are normal. Orbital fat is normal without infiltration or
mass.

Serpiginous tubular structures are present in the superior orbit
bilaterally. These show enhancement postcontrast. These most likely
are dilated veins. Neurofibromas considered less likely given their
elongated tubular serpiginous shape. No evidence of cavernous sinus
thrombosis or cavernous carotid fistula. The cavernous sinus
enhances normally and symmetrically without enlargement or filling
defect. No enhancing mass lesion in the orbit.
IMPRESSION: Negative MRI of the brain with contrast. No evidence of infarct or
mass

Serpiginous tubular structures in the superior orbit bilaterally
most compatible with dilated veins . These most likely are varices.
No evidence of cavernous sinus thrombosis or cavernous carotid
fistula.

## 2017-11-04 ENCOUNTER — Emergency Department (HOSPITAL_COMMUNITY): Payer: Self-pay

## 2017-11-04 ENCOUNTER — Emergency Department (HOSPITAL_COMMUNITY)
Admission: EM | Admit: 2017-11-04 | Discharge: 2017-11-04 | Disposition: A | Discharge status: Home / Self Care | Payer: Self-pay | Attending: Emergency Medicine | Admitting: Emergency Medicine

## 2017-11-04 ENCOUNTER — Encounter (HOSPITAL_COMMUNITY): Payer: Self-pay | Admitting: *Deleted

## 2017-11-04 DIAGNOSIS — R079 Chest pain, unspecified: Secondary | ICD-10-CM

## 2017-11-04 DIAGNOSIS — Z79899 Other long term (current) drug therapy: Secondary | ICD-10-CM | POA: Insufficient documentation

## 2017-11-04 DIAGNOSIS — Z7982 Long term (current) use of aspirin: Secondary | ICD-10-CM | POA: Insufficient documentation

## 2017-11-04 DIAGNOSIS — R0602 Shortness of breath: Secondary | ICD-10-CM | POA: Insufficient documentation

## 2017-11-04 DIAGNOSIS — F17210 Nicotine dependence, cigarettes, uncomplicated: Secondary | ICD-10-CM | POA: Insufficient documentation

## 2017-11-04 DIAGNOSIS — I10 Essential (primary) hypertension: Secondary | ICD-10-CM | POA: Insufficient documentation

## 2017-11-04 LAB — TROPONIN I
Troponin I: <0.03 ng/mL (ref <0.03)
Troponin I: <0.03 ng/mL (ref <0.03)

## 2017-11-04 LAB — BASIC METABOLIC PANEL
Anion gap: 8 (ref 5–15)
BUN: 17 mg/dL (ref 6–20)
CO2: 23 mmol/L (ref 22–32)
Calcium: 9 mg/dL (ref 8.9–10.3)
Chloride: 108 mmol/L (ref 101–111)
Creatinine, Ser: 0.91 mg/dL (ref 0.61–1.24)
GFR calc Af Amer: >60 mL/min (ref >60)
GFR calc non Af Amer: >60 mL/min (ref >60)
Glucose, Bld: 118 mg/dL — ABNORMAL HIGH (ref 65–99)
Potassium: 4.3 mmol/L (ref 3.5–5.1)
Sodium: 139 mmol/L (ref 135–145)

## 2017-11-04 LAB — CBC
HCT: 36.8 % — ABNORMAL LOW (ref 39.0–52.0)
Hemoglobin: 11.5 g/dL — ABNORMAL LOW (ref 13.0–17.0)
MCH: 22.6 pg — ABNORMAL LOW (ref 26.0–34.0)
MCHC: 31.3 g/dL (ref 30.0–36.0)
MCV: 72.3 fL — ABNORMAL LOW (ref 78.0–100.0)
Platelets: 213 10*3/uL (ref 150–400)
RBC: 5.09 MIL/uL (ref 4.22–5.81)
RDW: 16.8 % — ABNORMAL HIGH (ref 11.5–15.5)
WBC: 7.1 10*3/uL (ref 4.0–10.5)

## 2017-11-04 LAB — BRAIN NATRIURETIC PEPTIDE: B Natriuretic Peptide: 17 pg/mL (ref 0.0–100.0)

## 2017-11-04 MED ORDER — AMLODIPINE BESYLATE 5 MG PO TABS
5.0000 mg | ORAL_TABLET | Freq: Once | ORAL | Status: AC
  Administered 2017-11-04: 5 mg via ORAL
  Filled 2017-11-04: qty 1

## 2017-11-04 MED ORDER — ASPIRIN 81 MG PO CHEW: 162.0000 mg | CHEWABLE_TABLET | Freq: Once | ORAL | Status: DC

## 2017-11-04 MED ORDER — CHLORTHALIDONE 25 MG PO TABS: 25.0000 mg | ORAL_TABLET | Freq: Every day | ORAL | 0 refills | Status: DC

## 2017-11-04 MED ORDER — CARVEDILOL 12.5 MG PO TABS: 12.5000 mg | ORAL_TABLET | Freq: Two times a day (BID) | ORAL | 0 refills | Status: DC

## 2017-11-04 MED ORDER — CHLORTHALIDONE 25 MG PO TABS
25.0000 mg | ORAL_TABLET | Freq: Once | ORAL | Status: AC
  Administered 2017-11-04: 25 mg via ORAL
  Filled 2017-11-04: qty 1

## 2017-11-04 MED ORDER — LOSARTAN POTASSIUM 25 MG PO TABS
50.0000 mg | ORAL_TABLET | Freq: Once | ORAL | Status: AC
  Administered 2017-11-04: 50 mg via ORAL
  Filled 2017-11-04: qty 2

## 2017-11-04 MED ORDER — LOSARTAN POTASSIUM 50 MG PO TABS: 50.0000 mg | ORAL_TABLET | Freq: Every day | ORAL | 0 refills | Status: DC

## 2017-11-04 MED ORDER — CARVEDILOL 12.5 MG PO TABS
12.5000 mg | ORAL_TABLET | Freq: Two times a day (BID) | ORAL | Status: DC
  Administered 2017-11-04: 12.5 mg via ORAL
  Filled 2017-11-04: qty 1

## 2017-11-04 MED ORDER — LABETALOL HCL 5 MG/ML IV SOLN
10.0000 mg | Freq: Once | INTRAVENOUS | Status: AC
  Administered 2017-11-04: 10 mg via INTRAVENOUS
  Filled 2017-11-04: qty 4

## 2017-11-04 MED ORDER — ASPIRIN 81 MG PO CHEW
324.0000 mg | CHEWABLE_TABLET | Freq: Once | ORAL | Status: AC
  Administered 2017-11-04: 324 mg via ORAL
  Filled 2017-11-04: qty 4

## 2017-11-04 MED ORDER — AMLODIPINE BESYLATE 5 MG PO TABS: 5.0000 mg | ORAL_TABLET | Freq: Every day | ORAL | 0 refills | Status: AC

## 2017-11-04 NOTE — ED Provider Notes (Signed)
[facility] EMERGENCY DEPARTMENT Provider Note   CSN: [id number] Arrival date & time: 11/04/17  1016     History   Chief Complaint Chief Complaint  Patient presents with  . Chest Pain    HPI John Cain is a 37 y.o. male with a history of hypertension, dilated cardiomyopathy with severe hypokinesis with ejection fraction of 25% but no coronary artery pathology based on cardiac cath dated 01/29/2016 presenting with shortness of breath and chest pressure which he woke with this morning.  He was on multiple blood pressure medications as listed below which he ran out of approximately 2 weeks ago.  He was having no symptoms of chest pain, pressure, shortness of breath or peripheral edema until this morning when he woke.  He denies nausea or vomiting, diaphoresis, back or abdominal pain.  He is symptom free as long as he is sitting, symptoms worsen when supine.  He is currently symptom-free.  He is taking 1 baby aspirin prior to arrival.  The history is provided by the patient.    Past Medical History:  Diagnosis Date  . Fibromatosis   . Hypertension   . Sickle cell trait [facility])     Patient Active Problem List   Diagnosis Date Noted  . Cardiomyopathy (HCC) 01/29/2016  . Afferent pupillary defect   . NICM (nonischemic cardiomyopathy) (HCC)   . Exotropia of left eye 01/28/2016  . Chest pain 01/27/2016  . Hypertensive emergency 01/27/2016  . Hypertensive urgency 01/27/2016    Past Surgical History:  Procedure Laterality Date  . CARDIAC CATHETERIZATION N/A 01/29/2016   Procedure: Right/Left Heart Cath and Coronary Angiography;  Surgeon: Lennette Bihari, MD;  Location: [facility] INVASIVE CV LAB;  Service: Cardiovascular;  Laterality: N/A;       Home Medications    Prior to Admission medications   Medication Sig Start Date End Date Taking? Authorizing Provider  aspirin 81 MG EC tablet Take 1 tablet (81 mg total) by mouth daily. 09/27/16  Yes Jacalyn Lefevre, MD  carvedilol (COREG)  12.5 MG tablet Take 1 tablet (12.5 mg total) by mouth 2 (two) times daily with a meal. 09/27/16  Yes Jacalyn Lefevre, MD  chlorthalidone (HYGROTON) 25 MG tablet Take 1 tablet (25 mg total) by mouth daily. 09/27/16  Yes Jacalyn Lefevre, MD  losartan (COZAAR) 50 MG tablet Take 1 tablet (50 mg total) by mouth daily. 09/27/16  Yes Jacalyn Lefevre, MD  amLODipine (NORVASC) 5 MG tablet Take 1 tablet (5 mg total) by mouth daily. 11/04/17   Burgess Amor, PA-C  carvedilol (COREG) 12.5 MG tablet Take 1 tablet (12.5 mg total) by mouth 2 (two) times daily with a meal. 11/04/17   Felicitas Sine, Raynelle Fanning, PA-C  chlorthalidone (HYGROTON) 25 MG tablet Take 1 tablet (25 mg total) by mouth daily. 11/04/17   Burgess Amor, PA-C  losartan (COZAAR) 50 MG tablet Take 1 tablet (50 mg total) by mouth daily. 11/04/17   Burgess Amor, PA-C    Family History Family History  Adopted: Yes    Social History Social History   Tobacco Use  . Smoking status: Current Every Day Smoker    Packs/day: 0.50    Types: Cigarettes  . Smokeless tobacco: Never Used  Substance Use Topics  . Alcohol use: Yes    Comment: daily 2-3 beers  . Drug use: No     Allergies   Bee venom   Review of Systems Review of Systems  Constitutional: Negative for fever.  HENT: Negative for congestion and sore  throat.   Eyes: Negative.   Respiratory: Positive for chest tightness and shortness of breath.   Cardiovascular: Negative for chest pain, palpitations and leg swelling.  Gastrointestinal: Negative for abdominal pain and nausea.  Genitourinary: Negative.   Musculoskeletal: Negative for arthralgias, joint swelling and neck pain.  Skin: Negative.  Negative for rash and wound.  Neurological: Negative for dizziness, weakness, light-headedness, numbness and headaches.  Psychiatric/Behavioral: Negative.      Physical Exam Updated Vital Signs BP (!) 164/119   Pulse 76   Temp 98.4 F (36.9 C) (Oral)   Resp (!) 21   Ht 6\' 3"  (1.905 m)   Wt 108.9 kg  (240 lb)   SpO2 100%   BMI 30.00 kg/m   Physical Exam  Constitutional: He appears well-developed and well-nourished.  HENT:  Head: Normocephalic and atraumatic.  Eyes: Conjunctivae are normal.  Neck: Normal range of motion.  Cardiovascular: Normal rate, regular rhythm, normal heart sounds and intact distal pulses.  Pulmonary/Chest: Effort normal and breath sounds normal. He has no wheezes. He has no rhonchi. He has no rales.  Abdominal: Soft. Bowel sounds are normal. There is no tenderness.  Musculoskeletal: Normal range of motion.       Right lower leg: He exhibits no tenderness and no edema.       Left lower leg: He exhibits no tenderness and no edema.  Neurological: He is alert.  Skin: Skin is warm and dry.  Psychiatric: He has a normal mood and affect.  Nursing note and vitals reviewed.    ED Treatments / Results  Labs (all labs ordered are listed, but only abnormal results are displayed) Results for orders placed or performed during the hospital encounter of 11/04/17  Basic metabolic panel  Result Value Ref Range   Sodium 139 135 - 145 mmol/L   Potassium 4.3 3.5 - 5.1 mmol/L   Chloride 108 101 - 111 mmol/L   CO2 23 22 - 32 mmol/L   Glucose, Bld 118 (H) 65 - 99 mg/dL   BUN 17 6 - 20 mg/dL   Creatinine, Ser 8.09 0.61 - 1.24 mg/dL   Calcium 9.0 8.9 - 98.3 mg/dL   GFR calc non Af Amer >60 >60 mL/min   GFR calc Af Amer >60 >60 mL/min   Anion gap 8 5 - 15  CBC  Result Value Ref Range   WBC 7.1 4.0 - 10.5 K/uL   RBC 5.09 4.22 - 5.81 MIL/uL   Hemoglobin 11.5 (L) 13.0 - 17.0 g/dL   HCT 38.2 (L) 50.5 - 39.7 %   MCV 72.3 (L) 78.0 - 100.0 fL   MCH 22.6 (L) 26.0 - 34.0 pg   MCHC 31.3 30.0 - 36.0 g/dL   RDW 67.3 (H) 41.9 - 37.9 %   Platelets 213 150 - 400 K/uL  Troponin I  (0, 3)  Result Value Ref Range   Troponin I <0.03 <0.03 ng/mL  Brain natriuretic peptide (order if patient c/o SOB ONLY)  Result Value Ref Range   B Natriuretic Peptide 17.0 0.0 - 100.0 pg/mL    Troponin I  Result Value Ref Range   Troponin I <0.03 <0.03 ng/mL   Dg Chest Portable 1 View  Result Date: 11/04/2017 CLINICAL DATA:  Chest pain and shortness of breath. EXAM: PORTABLE CHEST 1 VIEW COMPARISON:  12/06/2016 FINDINGS: Prominent cardiac silhouette. There is no evidence of focal airspace consolidation, pleural effusion or pneumothorax. Osseous structures are without acute abnormality. Soft tissues are grossly normal. IMPRESSION: Prominent cardiac  silhouette, which may be exaggerated by portable AP technique. No evidence of focal consolidation or pulmonary edema. Electronically Signed   By: Ted Mcalpine M.D.   On: 11/04/2017 11:29     EKG ED ECG REPORT   Date: 11/04/2017  Rate: 89  Rhythm: normal sinus rhythm  QRS Axis: normal  Intervals: normal  ST/T Wave abnormalities: normal  Conduction Disutrbances:none  Narrative Interpretation: inferior q waves unchanged from prior. LVH  Old EKG Reviewed: unchanged  I have personally reviewed the EKG tracing and agree with the computerized printout as noted.   Radiology Dg Chest Portable 1 View  Result Date: 11/04/2017 CLINICAL DATA:  Chest pain and shortness of breath. EXAM: PORTABLE CHEST 1 VIEW COMPARISON:  12/06/2016 FINDINGS: Prominent cardiac silhouette. There is no evidence of focal airspace consolidation, pleural effusion or pneumothorax. Osseous structures are without acute abnormality. Soft tissues are grossly normal. IMPRESSION: Prominent cardiac silhouette, which may be exaggerated by portable AP technique. No evidence of focal consolidation or pulmonary edema. Electronically Signed   By: Ted Mcalpine M.D.   On: 11/04/2017 11:29    Procedures Procedures (including critical care time)  Medications Ordered in ED Medications  carvedilol (COREG) tablet 12.5 mg (12.5 mg Oral Given 11/04/17 1230)  labetalol (NORMODYNE,TRANDATE) injection 10 mg (10 mg Intravenous Given 11/04/17 1119)  aspirin chewable tablet  324 mg (324 mg Oral Given 11/04/17 1116)  amLODipine (NORVASC) tablet 5 mg (5 mg Oral Given 11/04/17 1231)  losartan (COZAAR) tablet 50 mg (50 mg Oral Given 11/04/17 1321)  chlorthalidone (HYGROTON) tablet 25 mg (25 mg Oral Given 11/04/17 1308)  labetalol (NORMODYNE,TRANDATE) injection 10 mg (10 mg Intravenous Given 11/04/17 1231)     Initial Impression / Assessment and Plan / ED Course  I have reviewed the triage vital signs and the nursing notes.  Pertinent labs & imaging results that were available during my care of the patient were reviewed by me and considered in my medical decision making (see chart for details).     Pt with noncompliance with his bp medications x 2 weeks with hypertensive urgency, no evidence of end organ damage today.  He was treated with labetalol IV and given his missed home medications here.  He had no persistent sx in the ed.  Deltra troponins negative.  ekg stable, no evidence of acute coronary event or chf.  He was prescribed his home bp medicines for the next month, advised that he needs to f/u with his cardiologist within the next week, ideally to re-establish care and for ongoing prescriptions.  Pt understands and states his med insurance has just been started so he can now do this.  Prn f/u anticipated here.  Final Clinical Impressions(s) / ED Diagnoses   Final diagnoses:  Essential hypertension  Chest pain, unspecified type    ED Discharge Orders        Ordered    amLODipine (NORVASC) 5 MG tablet  Daily     11/04/17 1552    carvedilol (COREG) 12.5 MG tablet  2 times daily with meals     11/04/17 1552    chlorthalidone (HYGROTON) 25 MG tablet  Daily     11/04/17 1552    losartan (COZAAR) 50 MG tablet  Daily     11/04/17 1552       Burgess Amor, PA-C 11/04/17 1737    Loren Racer, MD 11/06/17 [phone]

## 2017-11-04 NOTE — ED Triage Notes (Signed)
Chest pain onset today, states he is out of blood pressure medication

## 2017-11-04 NOTE — Discharge Instructions (Signed)
Take your medicines as prescribed and make sure you do not run out of your medications.

## 2017-12-20 ENCOUNTER — Encounter (HOSPITAL_COMMUNITY): Payer: Self-pay | Admitting: Emergency Medicine

## 2017-12-20 ENCOUNTER — Other Ambulatory Visit: Payer: Self-pay

## 2017-12-20 ENCOUNTER — Emergency Department (HOSPITAL_COMMUNITY)
Admission: EM | Admit: 2017-12-20 | Discharge: 2017-12-21 | Disposition: A | Discharge status: Home / Self Care | Payer: Self-pay | Attending: Emergency Medicine | Admitting: Emergency Medicine

## 2017-12-20 DIAGNOSIS — I10 Essential (primary) hypertension: Secondary | ICD-10-CM | POA: Insufficient documentation

## 2017-12-20 DIAGNOSIS — Z7982 Long term (current) use of aspirin: Secondary | ICD-10-CM | POA: Insufficient documentation

## 2017-12-20 DIAGNOSIS — F17210 Nicotine dependence, cigarettes, uncomplicated: Secondary | ICD-10-CM | POA: Insufficient documentation

## 2017-12-20 DIAGNOSIS — K0889 Other specified disorders of teeth and supporting structures: Secondary | ICD-10-CM | POA: Insufficient documentation

## 2017-12-20 MED ORDER — PENICILLIN V POTASSIUM 250 MG PO TABS
500.0000 mg | ORAL_TABLET | Freq: Once | ORAL | Status: AC
  Administered 2017-12-20: 500 mg via ORAL
  Filled 2017-12-20: qty 2

## 2017-12-20 MED ORDER — HYDROCODONE-ACETAMINOPHEN 5-325 MG PO TABS
1.0000 | ORAL_TABLET | Freq: Once | ORAL | Status: AC
  Administered 2017-12-20: 1 via ORAL
  Filled 2017-12-20: qty 1

## 2017-12-20 MED ORDER — NAPROXEN 500 MG PO TABS: 500.0000 mg | ORAL_TABLET | Freq: Two times a day (BID) | ORAL | 0 refills | Status: DC

## 2017-12-20 MED ORDER — AMLODIPINE BESYLATE 5 MG PO TABS
5.0000 mg | ORAL_TABLET | Freq: Every day | ORAL | Status: DC
  Administered 2017-12-20: 5 mg via ORAL
  Filled 2017-12-20: qty 1

## 2017-12-20 MED ORDER — LOSARTAN POTASSIUM 25 MG PO TABS
50.0000 mg | ORAL_TABLET | Freq: Every day | ORAL | Status: DC
  Administered 2017-12-20: 50 mg via ORAL
  Filled 2017-12-20: qty 2

## 2017-12-20 MED ORDER — PENICILLIN V POTASSIUM 500 MG PO TABS: 500.0000 mg | ORAL_TABLET | Freq: Four times a day (QID) | ORAL | 0 refills | Status: AC

## 2017-12-20 MED ORDER — IBUPROFEN 800 MG PO TABS
800.0000 mg | ORAL_TABLET | Freq: Once | ORAL | Status: AC
  Administered 2017-12-20: 800 mg via ORAL
  Filled 2017-12-20: qty 1

## 2017-12-20 NOTE — ED Triage Notes (Signed)
Pt c/o left upper dental pain that causes his head to hurt.

## 2017-12-20 NOTE — Discharge Instructions (Signed)
Follow-up with your dentist.  Take your blood pressure medications as prescribed.  Return to the ED if you develop new or worsening symptoms.

## 2017-12-20 NOTE — ED Provider Notes (Signed)
[facility] EMERGENCY DEPARTMENT Provider Note   CSN: [id number] Arrival date & time: 12/20/17  2244     History   Chief Complaint Chief Complaint  Patient presents with  . Dental Pain    HPI John Cain is a 37 y.o. male.  Patient with history of hypertension presenting with left upper dental pain for the past several days.  Denies any recent injury or procedure.  Has tried Excedrin at home without relief.  States the dental pain is causing a headache making his blood pressure go up.  States compliance with his blood pressure medications.  No difficulty breathing or difficulty swallowing.  No chest pain or shortness of breath.  No fever or vomiting.  He recently reestablished care with a dentist but has not seen them yet.   The history is provided by the patient.  Dental Pain      Past Medical History:  Diagnosis Date  . Fibromatosis   . Hypertension   . Sickle cell trait [facility])     Patient Active Problem List   Diagnosis Date Noted  . Cardiomyopathy (HCC) 01/29/2016  . Afferent pupillary defect   . NICM (nonischemic cardiomyopathy) (HCC)   . Exotropia of left eye 01/28/2016  . Chest pain 01/27/2016  . Hypertensive emergency 01/27/2016  . Hypertensive urgency 01/27/2016    Past Surgical History:  Procedure Laterality Date  . CARDIAC CATHETERIZATION N/A 01/29/2016   Procedure: Right/Left Heart Cath and Coronary Angiography;  Surgeon: Lennette Bihari, MD;  Location: [facility] INVASIVE CV LAB;  Service: Cardiovascular;  Laterality: N/A;       Home Medications    Prior to Admission medications   Medication Sig Start Date End Date Taking? Authorizing Provider  amLODipine (NORVASC) 5 MG tablet Take 1 tablet (5 mg total) by mouth daily. 11/04/17   Burgess Amor, PA-C  aspirin 81 MG EC tablet Take 1 tablet (81 mg total) by mouth daily. 09/27/16   Jacalyn Lefevre, MD  carvedilol (COREG) 12.5 MG tablet Take 1 tablet (12.5 mg total) by mouth 2 (two) times daily with a meal.  09/27/16   Jacalyn Lefevre, MD  carvedilol (COREG) 12.5 MG tablet Take 1 tablet (12.5 mg total) by mouth 2 (two) times daily with a meal. 11/04/17   Idol, Raynelle Fanning, PA-C  chlorthalidone (HYGROTON) 25 MG tablet Take 1 tablet (25 mg total) by mouth daily. 09/27/16   Jacalyn Lefevre, MD  chlorthalidone (HYGROTON) 25 MG tablet Take 1 tablet (25 mg total) by mouth daily. 11/04/17   Burgess Amor, PA-C  losartan (COZAAR) 50 MG tablet Take 1 tablet (50 mg total) by mouth daily. 09/27/16   Jacalyn Lefevre, MD  losartan (COZAAR) 50 MG tablet Take 1 tablet (50 mg total) by mouth daily. 11/04/17   Burgess Amor, PA-C    Family History Family History  Adopted: Yes    Social History Social History   Tobacco Use  . Smoking status: Current Every Day Smoker    Packs/day: 0.50    Types: Cigarettes  . Smokeless tobacco: Never Used  Substance Use Topics  . Alcohol use: Yes    Comment: daily 2-3 beers  . Drug use: No     Allergies   Bee venom   Review of Systems Review of Systems  Constitutional: Negative for activity change, appetite change and fever.  HENT: Positive for dental problem. Negative for rhinorrhea and trouble swallowing.   Respiratory: Negative for cough, chest tightness and shortness of breath.   Gastrointestinal: Negative for abdominal  pain, nausea and vomiting.  Genitourinary: Negative for dysuria, hematuria and urgency.  Musculoskeletal: Negative for arthralgias and myalgias.  Skin: Negative for rash.  Neurological: Negative for dizziness, weakness and headaches.    all other systems are negative except as noted in the HPI and PMH.   Physical Exam Updated Vital Signs BP (!) 187/130 (BP Location: Right Arm)   Pulse 93   Temp 97.9 F (36.6 C) (Oral)   Resp 16   Ht 6\' 3"  (1.905 m)   Wt 108.9 kg (240 lb)   SpO2 100%   BMI 30.00 kg/m   Physical Exam  Constitutional: He is oriented to person, place, and time. He appears well-developed and well-nourished. No distress.  HENT:    Head: Normocephalic and atraumatic.  Mouth/Throat: Oropharynx is clear and moist. No oropharyngeal exudate.  Left upper incisor is tender to palpation.  There is a small chip off the occlusive surface.  There is no abscess.  Floor of mouth is soft.  No malocclusion or trismus  Eyes: Conjunctivae and EOM are normal. Pupils are equal, round, and reactive to light.  Neck: Normal range of motion. Neck supple.  No meningismus.  Cardiovascular: Normal rate, regular rhythm, normal heart sounds and intact distal pulses.  No murmur heard. Pulmonary/Chest: Effort normal and breath sounds normal. No respiratory distress.  Abdominal: Soft. There is no tenderness. There is no rebound and no guarding.  Musculoskeletal: Normal range of motion. He exhibits no edema or tenderness.  Neurological: He is alert and oriented to person, place, and time. No cranial nerve deficit. He exhibits normal muscle tone. Coordination normal.  No ataxia on finger to nose bilaterally. No pronator drift. 5/5 strength throughout. CN 2-12 intact.Equal grip strength. Sensation intact.   Skin: Skin is warm.  Psychiatric: He has a normal mood and affect. His behavior is normal.  Nursing note and vitals reviewed.    ED Treatments / Results  Labs (all labs ordered are listed, but only abnormal results are displayed) Labs Reviewed - No data to display  EKG  EKG Interpretation None       Radiology No results found.  Procedures Procedures (including critical care time)  Medications Ordered in ED Medications  penicillin v potassium (VEETID) tablet 500 mg (not administered)  HYDROcodone-acetaminophen (NORCO/VICODIN) 5-325 MG per tablet 1 tablet (not administered)  ibuprofen (ADVIL,MOTRIN) tablet 800 mg (not administered)     Initial Impression / Assessment and Plan / ED Course  I have reviewed the triage vital signs and the nursing notes.  Pertinent labs & imaging results that were available during my care of the  patient were reviewed by me and considered in my medical decision making (see chart for details).    Dental pain without abscess or evidence of Ludwig's angina.  Patient is hypertensive which he states is due to pain.  States he has been taking his blood pressure medication.  Patient started on penicillin and pain control.  Dental resources given.  He is given his home blood pressure medications and instructed to take his blood pressure medications regularly.  Follow-up with dentist and PCP.  Return precautions discussed.  Final Clinical Impressions(s) / ED Diagnoses   Final diagnoses:  Pain, dental  Essential hypertension    ED Discharge Orders    None       Eligah Anello, Jeannett Senior, MD 12/21/17 0214

## 2017-12-21 NOTE — ED Notes (Signed)
Pt alert & oriented x4, stable gait. Patient given discharge instructions, paperwork & prescription(s). Patient  instructed to stop at the registration desk to finish any additional paperwork. Patient verbalized understanding. Pt left department w/ no further questions. 

## 2018-05-08 ENCOUNTER — Other Ambulatory Visit: Payer: Self-pay

## 2018-05-08 ENCOUNTER — Encounter (HOSPITAL_COMMUNITY): Payer: Self-pay | Admitting: Emergency Medicine

## 2018-05-08 ENCOUNTER — Emergency Department (HOSPITAL_COMMUNITY)
Admission: EM | Admit: 2018-05-08 | Discharge: 2018-05-08 | Disposition: A | Discharge status: Home / Self Care | Payer: Self-pay | Attending: Emergency Medicine | Admitting: Emergency Medicine

## 2018-05-08 DIAGNOSIS — Z7982 Long term (current) use of aspirin: Secondary | ICD-10-CM | POA: Insufficient documentation

## 2018-05-08 DIAGNOSIS — I10 Essential (primary) hypertension: Secondary | ICD-10-CM

## 2018-05-08 DIAGNOSIS — Z79899 Other long term (current) drug therapy: Secondary | ICD-10-CM | POA: Insufficient documentation

## 2018-05-08 DIAGNOSIS — F17210 Nicotine dependence, cigarettes, uncomplicated: Secondary | ICD-10-CM | POA: Insufficient documentation

## 2018-05-08 LAB — CBC WITH DIFFERENTIAL/PLATELET
Basophils Absolute: 0.1 10*3/uL (ref 0.0–0.1)
Basophils Relative: 2 %
Eosinophils Absolute: 0.2 10*3/uL (ref 0.0–0.7)
Eosinophils Relative: 4 %
HCT: 40.7 % (ref 39.0–52.0)
Hemoglobin: 13.3 g/dL (ref 13.0–17.0)
Lymphocytes Relative: 29 %
Lymphs Abs: 1.3 10*3/uL (ref 0.7–4.0)
MCH: 23.1 pg — ABNORMAL LOW (ref 26.0–34.0)
MCHC: 32.7 g/dL (ref 30.0–36.0)
MCV: 70.5 fL — ABNORMAL LOW (ref 78.0–100.0)
Monocytes Absolute: 0.5 10*3/uL (ref 0.1–1.0)
Monocytes Relative: 11 %
Neutro Abs: 2.4 10*3/uL (ref 1.7–7.7)
Neutrophils Relative %: 54 %
Platelets: 168 10*3/uL (ref 150–400)
RBC: 5.77 MIL/uL (ref 4.22–5.81)
RDW: 18.8 % — ABNORMAL HIGH (ref 11.5–15.5)
WBC: 4.4 10*3/uL (ref 4.0–10.5)

## 2018-05-08 LAB — BASIC METABOLIC PANEL
Anion gap: 9 (ref 5–15)
BUN: 11 mg/dL (ref 6–20)
CO2: 21 mmol/L — ABNORMAL LOW (ref 22–32)
Calcium: 8.6 mg/dL — ABNORMAL LOW (ref 8.9–10.3)
Chloride: 109 mmol/L (ref 98–111)
Creatinine, Ser: 0.9 mg/dL (ref 0.61–1.24)
GFR calc Af Amer: >60 mL/min (ref >60)
GFR calc non Af Amer: >60 mL/min (ref >60)
Glucose, Bld: 110 mg/dL — ABNORMAL HIGH (ref 70–99)
Potassium: 3.5 mmol/L (ref 3.5–5.1)
Sodium: 139 mmol/L (ref 135–145)

## 2018-05-08 MED ORDER — CARVEDILOL 12.5 MG PO TABS: 12.5000 mg | ORAL_TABLET | Freq: Two times a day (BID) | ORAL | 2 refills | Status: DC

## 2018-05-08 MED ORDER — LOSARTAN POTASSIUM 25 MG PO TABS
50.0000 mg | ORAL_TABLET | Freq: Once | ORAL | Status: AC
  Administered 2018-05-08: 50 mg via ORAL
  Filled 2018-05-08: qty 2

## 2018-05-08 MED ORDER — CARVEDILOL 12.5 MG PO TABS
12.5000 mg | ORAL_TABLET | Freq: Once | ORAL | Status: AC
  Administered 2018-05-08: 12.5 mg via ORAL
  Filled 2018-05-08: qty 1

## 2018-05-08 MED ORDER — LOSARTAN POTASSIUM 50 MG PO TABS: 50.0000 mg | ORAL_TABLET | Freq: Every day | ORAL | 2 refills | Status: DC

## 2018-05-08 NOTE — ED Triage Notes (Signed)
Pt reports "I feel like my blood pressure is high." Hx of HTN, but is not taking medication due to losing insurance. Pt endorses dizziness and "feeling weird."

## 2018-05-14 NOTE — ED Provider Notes (Signed)
[facility] EMERGENCY DEPARTMENT Provider Note   CSN: [id number] Arrival date & time: 05/08/18  1143     History   Chief Complaint Chief Complaint  Patient presents with  . Hypertension    HPI John Cain is a 37 y.o. male.  HPI   37 year old male with hypertension.  History of the same.  He has been noncompliant with his medications for financial reasons.  States for the last few days he has had this vague sense of dizziness and is not feeling quite right.  Denies any acute pain.  No nausea.  No acute visual changes.  No dyspnea.  No orthopnea.  No unusual swelling.  Past Medical History:  Diagnosis Date  . Fibromatosis   . Hypertension   . Sickle cell trait [facility])     Patient Active Problem List   Diagnosis Date Noted  . Cardiomyopathy (HCC) 01/29/2016  . Afferent pupillary defect   . NICM (nonischemic cardiomyopathy) (HCC)   . Exotropia of left eye 01/28/2016  . Chest pain 01/27/2016  . Hypertensive emergency 01/27/2016  . Hypertensive urgency 01/27/2016    Past Surgical History:  Procedure Laterality Date  . CARDIAC CATHETERIZATION N/A 01/29/2016   Procedure: Right/Left Heart Cath and Coronary Angiography;  Surgeon: Lennette Bihari, MD;  Location: [facility] INVASIVE CV LAB;  Service: Cardiovascular;  Laterality: N/A;        Home Medications    Prior to Admission medications   Medication Sig Start Date End Date Taking? Authorizing Provider  amLODipine (NORVASC) 5 MG tablet Take 1 tablet (5 mg total) by mouth daily. 11/04/17  Yes Idol, Raynelle Fanning, PA-C  aspirin 81 MG EC tablet Take 1 tablet (81 mg total) by mouth daily. 09/27/16  Yes Jacalyn Lefevre, MD  chlorthalidone (HYGROTON) 25 MG tablet Take 1 tablet (25 mg total) by mouth daily. 09/27/16  Yes Jacalyn Lefevre, MD  naproxen (NAPROSYN) 500 MG tablet Take 1 tablet (500 mg total) by mouth 2 (two) times daily. 12/20/17  Yes Rancour, Jeannett Senior, MD  carvedilol (COREG) 12.5 MG tablet Take 1 tablet (12.5 mg total) by mouth 2  (two) times daily with a meal. 05/08/18   Raeford Razor, MD  losartan (COZAAR) 50 MG tablet Take 1 tablet (50 mg total) by mouth daily. 05/08/18   Raeford Razor, MD    Family History Family History  Adopted: Yes    Social History Social History   Tobacco Use  . Smoking status: Current Every Day Smoker    Packs/day: 0.50    Types: Cigarettes  . Smokeless tobacco: Never Used  Substance Use Topics  . Alcohol use: Yes    Comment: 2x a week  . Drug use: No     Allergies   Bee venom   Review of Systems Review of Systems All systems reviewed and negative, other than as noted in HPI.   Physical Exam Updated Vital Signs BP (!) 159/119   Pulse 91   Temp 98.5 F (36.9 C) (Temporal)   Resp 18   Ht 6' (1.829 m)   Wt 108.9 kg (240 lb)   SpO2 97%   BMI 32.55 kg/m   Physical Exam  Constitutional: He appears well-developed and well-nourished. No distress.  HENT:  Head: Normocephalic and atraumatic.  Eyes: Conjunctivae are normal. Right eye exhibits no discharge. Left eye exhibits no discharge.  Neck: Neck supple.  Cardiovascular: Normal rate, regular rhythm and normal heart sounds. Exam reveals no gallop and no friction rub.  No murmur heard. Pulmonary/Chest: Effort  normal and breath sounds normal. No respiratory distress.  Abdominal: Soft. He exhibits no distension. There is no tenderness.  Musculoskeletal: He exhibits no edema or tenderness.  Neurological: He is alert.  Skin: Skin is warm and dry.  Psychiatric: He has a normal mood and affect. His behavior is normal. Thought content normal.  Nursing note and vitals reviewed.    ED Treatments / Results  Labs (all labs ordered are listed, but only abnormal results are displayed) Labs Reviewed  CBC WITH DIFFERENTIAL/PLATELET - Abnormal; Notable for the following components:      Result Value   MCV 70.5 (*)    MCH 23.1 (*)    RDW 18.8 (*)    All other components within normal limits  BASIC METABOLIC PANEL -  Abnormal; Notable for the following components:   CO2 21 (*)    Glucose, Bld 110 (*)    Calcium 8.6 (*)    All other components within normal limits    EKG EKG Interpretation  Date/Time:  Monday May 08 2018 11:52:38 EDT Ventricular Rate:  107 PR Interval:  160 QRS Duration: 98 QT Interval:  372 QTC Calculation: 496 R Axis:   -24 Text Interpretation:  Sinus tachycardia Left atrial enlargement Left ventricular hypertrophy Confirmed by Raeford Razor [phone]) on 05/08/2018 12:32:48 PM   Radiology No results found.  Procedures Procedures (including critical care time)  Medications Ordered in ED Medications  carvedilol (COREG) tablet 12.5 mg (12.5 mg Oral Given 05/08/18 1249)  losartan (COZAAR) tablet 50 mg (50 mg Oral Given 05/08/18 1249)     Initial Impression / Assessment and Plan / ED Course  I have reviewed the triage vital signs and the nursing notes.  Pertinent labs & imaging results that were available during my care of the patient were reviewed by me and considered in my medical decision making (see chart for details).     37 year old male with hypertension.  History of the same.  Vague sense of just not feeling well.  His exam is nonfocal.  I doubt acute endorgan damage.  He is prescribed Coreg and losartan which he has been on previously.  Return precautions were discussed.  It has been determined that no acute conditions requiring further emergency intervention are present at this time. The patient has been advised of the diagnosis and plan. I reviewed any labs and imaging including any potential incidental findings. We have discussed signs and symptoms that warrant return to the ED and they are listed in the discharge instructions.    Final Clinical Impressions(s) / ED Diagnoses   Final diagnoses:  Essential hypertension    ED Discharge Orders        Ordered    carvedilol (COREG) 12.5 MG tablet  2 times daily with meals     05/08/18 1408    losartan  (COZAAR) 50 MG tablet  Daily     05/08/18 1408       Raeford Razor, MD 05/14/18 2014

## 2018-11-08 ENCOUNTER — Encounter (HOSPITAL_COMMUNITY): Payer: Self-pay | Admitting: Emergency Medicine

## 2018-11-08 ENCOUNTER — Emergency Department (HOSPITAL_COMMUNITY): Payer: Self-pay

## 2018-11-08 ENCOUNTER — Other Ambulatory Visit: Payer: Self-pay

## 2018-11-08 ENCOUNTER — Emergency Department (HOSPITAL_COMMUNITY)
Admission: EM | Admit: 2018-11-08 | Discharge: 2018-11-08 | Disposition: A | Discharge status: Home / Self Care | Payer: Self-pay | Attending: Emergency Medicine | Admitting: Emergency Medicine

## 2018-11-08 DIAGNOSIS — D573 Sickle-cell trait: Secondary | ICD-10-CM | POA: Insufficient documentation

## 2018-11-08 DIAGNOSIS — I10 Essential (primary) hypertension: Secondary | ICD-10-CM | POA: Insufficient documentation

## 2018-11-08 DIAGNOSIS — Z9114 Patient's other noncompliance with medication regimen: Secondary | ICD-10-CM | POA: Insufficient documentation

## 2018-11-08 DIAGNOSIS — F17210 Nicotine dependence, cigarettes, uncomplicated: Secondary | ICD-10-CM | POA: Insufficient documentation

## 2018-11-08 DIAGNOSIS — R51 Headache: Secondary | ICD-10-CM | POA: Insufficient documentation

## 2018-11-08 DIAGNOSIS — Z79899 Other long term (current) drug therapy: Secondary | ICD-10-CM | POA: Insufficient documentation

## 2018-11-08 DIAGNOSIS — Z7982 Long term (current) use of aspirin: Secondary | ICD-10-CM | POA: Insufficient documentation

## 2018-11-08 DIAGNOSIS — R0602 Shortness of breath: Secondary | ICD-10-CM | POA: Insufficient documentation

## 2018-11-08 HISTORY — DX: Patient's other noncompliance with medication regimen for other reason: Z91.148

## 2018-11-08 HISTORY — DX: Other cardiomyopathies: I42.8

## 2018-11-08 HISTORY — DX: Dilated cardiomyopathy: I42.0

## 2018-11-08 HISTORY — DX: Patient's other noncompliance with medication regimen: Z91.14

## 2018-11-08 HISTORY — DX: Other specified postprocedural states: Z98.890

## 2018-11-08 LAB — CBC WITH DIFFERENTIAL/PLATELET
Abs Immature Granulocytes: 0.06 10*3/uL (ref 0.00–0.07)
Basophils Absolute: 0.1 10*3/uL (ref 0.0–0.1)
Basophils Relative: 1 %
Eosinophils Absolute: 0.1 10*3/uL (ref 0.0–0.5)
Eosinophils Relative: 1 %
HCT: 44.6 % (ref 39.0–52.0)
Hemoglobin: 14.2 g/dL (ref 13.0–17.0)
Immature Granulocytes: 1 %
Lymphocytes Relative: 22 %
Lymphs Abs: 1.3 10*3/uL (ref 0.7–4.0)
MCH: 23.9 pg — ABNORMAL LOW (ref 26.0–34.0)
MCHC: 31.8 g/dL (ref 30.0–36.0)
MCV: 75.2 fL — ABNORMAL LOW (ref 80.0–100.0)
Monocytes Absolute: 0.6 10*3/uL (ref 0.1–1.0)
Monocytes Relative: 10 %
Neutro Abs: 3.8 10*3/uL (ref 1.7–7.7)
Neutrophils Relative %: 65 %
Platelets: 186 10*3/uL (ref 150–400)
RBC: 5.93 MIL/uL — ABNORMAL HIGH (ref 4.22–5.81)
RDW: 16.4 % — ABNORMAL HIGH (ref 11.5–15.5)
WBC: 5.8 10*3/uL (ref 4.0–10.5)
nRBC: 0 % (ref 0.0–0.2)

## 2018-11-08 LAB — BASIC METABOLIC PANEL
Anion gap: 8 (ref 5–15)
BUN: 10 mg/dL (ref 6–20)
CO2: 23 mmol/L (ref 22–32)
Calcium: 9 mg/dL (ref 8.9–10.3)
Chloride: 109 mmol/L (ref 98–111)
Creatinine, Ser: 0.98 mg/dL (ref 0.61–1.24)
GFR calc Af Amer: >60 mL/min (ref >60)
GFR calc non Af Amer: >60 mL/min (ref >60)
Glucose, Bld: 88 mg/dL (ref 70–99)
Potassium: 3.6 mmol/L (ref 3.5–5.1)
Sodium: 140 mmol/L (ref 135–145)

## 2018-11-08 LAB — BRAIN NATRIURETIC PEPTIDE: B Natriuretic Peptide: 20 pg/mL (ref 0.0–100.0)

## 2018-11-08 LAB — TROPONIN I: Troponin I: <0.03 ng/mL (ref <0.03)

## 2018-11-08 MED ORDER — LOSARTAN POTASSIUM 50 MG PO TABS: 50.0000 mg | ORAL_TABLET | Freq: Every day | ORAL | 0 refills | Status: AC

## 2018-11-08 MED ORDER — HYDRALAZINE HCL 20 MG/ML IJ SOLN
5.0000 mg | Freq: Once | INTRAMUSCULAR | Status: AC
  Administered 2018-11-08: 5 mg via INTRAVENOUS
  Filled 2018-11-08: qty 1

## 2018-11-08 MED ORDER — HYDRALAZINE HCL 20 MG/ML IJ SOLN
5.0000 mg | Freq: Once | INTRAMUSCULAR | Status: AC
  Administered 2018-11-08: 5 mg via INTRAVENOUS

## 2018-11-08 MED ORDER — CARVEDILOL 12.5 MG PO TABS: 12.5000 mg | ORAL_TABLET | Freq: Two times a day (BID) | ORAL | 0 refills | Status: AC

## 2018-11-08 NOTE — ED Notes (Signed)
Patient transported to CT 

## 2018-11-08 NOTE — ED Triage Notes (Signed)
Pt was seen at the health department today trying to get put back on his BP medications. Pt's BP was so high at the health department that they sent pt to the ED to be evaluated. Pt c/o "a little headache, but I've been up all day, so it might be that". Pt reports he has daily headaches. Denies n/v, dizziness. Pt reports blurry vision.

## 2018-11-08 NOTE — Discharge Instructions (Addendum)
As discussed, it is very important that you take your blood pressure medication as directed every day.  Exercise, stop smoking and avoid salt.  Keep your appointment for tomorrow morning at 10 AM with the health department.  Return to the emergency room if you develop any worsening symptoms such as sudden, severe headache, chest pain, speech difficulty or sudden numbness of your face or extremities.

## 2018-11-08 NOTE — ED Provider Notes (Signed)
[facility] EMERGENCY DEPARTMENT Provider Note   CSN: [id number] Arrival date & time: 11/08/18  1548     History   Chief Complaint Chief Complaint  Patient presents with  . Hypertension    HPI Elpidio AnisMarcus V Plaisted is a 38 y.o. male.  HPI   Elpidio AnisMarcus V Dome is a 38 y.o. male with a past medical history of hypertension (poorly controlled), medication noncompliance and dilated cardiomyopathy who presents to the Emergency Department requesting evaluation for hypertension.  He states that he was seen earlier today at the local health department for his hypertension and he was advised to come to the ER for further evaluation because his blood pressure was "too high."  He states that he was taking carvedilol and losartan, but ran out of his medications approximately 1 month ago.  He states that he feels "fine" but does admit to having a diffuse headache on most days and he has noticed some shortness of breath when lying supine.  He states these symptoms have been going on for some time.  He also notes decreased vision from his left eye for approximately 1 year which is also worsened over the previous month.  He states that he can see movement from his left eye which has not changed or worsened.Marland Kitchen.  He denies dizziness, numbness or weakness of his extremities, edema, and chest pain.  He denies drug use but does admit to smoking cigarettes.   Past Medical History:  Diagnosis Date  . Dilated cardiomyopathy (HCC)   . Fibromatosis   . Hx of cardiac catheterization 2017   normal coronary arteries  . Hx of medication noncompliance   . Hypertension   . Nonischemic cardiomyopathy (HCC)   . Sickle cell trait [facility](HCC)     Patient Active Problem List   Diagnosis Date Noted  . Cardiomyopathy (HCC) 01/29/2016  . Afferent pupillary defect   . NICM (nonischemic cardiomyopathy) (HCC)   . Exotropia of left eye 01/28/2016  . Chest pain 01/27/2016  . Hypertensive emergency 01/27/2016  . Hypertensive urgency  01/27/2016    Past Surgical History:  Procedure Laterality Date  . CARDIAC CATHETERIZATION N/A 01/29/2016   Procedure: Right/Left Heart Cath and Coronary Angiography;  Surgeon: Lennette Biharihomas A Kelly, MD;  [facility];  Service: Cardiovascular;  Laterality: N/A;      Home Medications    Prior to Admission medications   Medication Sig Start Date End Date Taking? Authorizing Provider  amLODipine (NORVASC) 5 MG tablet Take 1 tablet (5 mg total) by mouth daily. 11/04/17   Burgess AmorIdol, Julie, PA-C  aspirin 81 MG EC tablet Take 1 tablet (81 mg total) by mouth daily. 09/27/16   Jacalyn LefevreHaviland, Julie, MD  carvedilol (COREG) 12.5 MG tablet Take 1 tablet (12.5 mg total) by mouth 2 (two) times daily with a meal. 05/08/18   Raeford RazorKohut, Stephen, MD  chlorthalidone (HYGROTON) 25 MG tablet Take 1 tablet (25 mg total) by mouth daily. 09/27/16   Jacalyn LefevreHaviland, Julie, MD  losartan (COZAAR) 50 MG tablet Take 1 tablet (50 mg total) by mouth daily. 05/08/18   Raeford RazorKohut, Stephen, MD  naproxen (NAPROSYN) 500 MG tablet Take 1 tablet (500 mg total) by mouth 2 (two) times daily. 12/20/17   Glynn Octaveancour, Stephen, MD    Family History Family History  Adopted: Yes    Social History Social History   Tobacco Use  . Smoking status: Current Every Day Smoker    Packs/day: 0.50    Types: Cigarettes  . Smokeless tobacco: Never Used  Substance Use Topics  . Alcohol use: Yes    Comment: 40 oz beer daily   . Drug use: No     Allergies   Bee venom   Review of Systems Review of Systems  Constitutional: Negative for appetite change, chills and fever.  HENT: Negative for sore throat and trouble swallowing.   Eyes: Positive for visual disturbance (chronic visual deficit of the left eye). Negative for pain.  Respiratory: Positive for shortness of breath (only while lying down).   Cardiovascular: Negative for chest pain, palpitations and leg swelling.  Gastrointestinal: Negative for abdominal pain, diarrhea, nausea and vomiting.    Genitourinary: Negative for decreased urine volume and dysuria.  Neurological: Positive for headaches. Negative for dizziness, syncope, speech difficulty, weakness and numbness.  Psychiatric/Behavioral: Negative for confusion and decreased concentration.     Physical Exam Updated Vital Signs BP (!) 191/135   Pulse 90   Temp 98.1 F (36.7 C) (Oral)   Resp (!) 25   Ht 6\' 2"  (1.88 m)   Wt 107 kg   SpO2 98%   BMI 30.30 kg/m   Physical Exam Vitals signs and nursing note reviewed.  Constitutional:      General: He is not in acute distress.    Appearance: Normal appearance. He is not ill-appearing or toxic-appearing.  HENT:     Head: Atraumatic.     Right Ear: Tympanic membrane and ear canal normal.     Left Ear: Tympanic membrane and ear canal normal.     Mouth/Throat:     Mouth: Mucous membranes are moist.     Pharynx: Oropharynx is clear.  Eyes:     Conjunctiva/sclera: Conjunctivae normal.     Comments: Left pupil is fixed.  Pt visualizes light and movement only, this is his baseline.  Right pupil is reactive to light.  No gross visual defects from the right eye  Neck:     Musculoskeletal: Full passive range of motion without pain and normal range of motion.     Trachea: Phonation normal.     Meningeal: Kernig's sign absent.  Cardiovascular:     Rate and Rhythm: Normal rate and regular rhythm.     Pulses: Normal pulses.  Pulmonary:     Effort: Pulmonary effort is normal.     Breath sounds: Normal breath sounds.  Musculoskeletal: Normal range of motion.     Right lower leg: No edema.     Left lower leg: No edema.  Skin:    General: Skin is warm.     Capillary Refill: Capillary refill takes less than 2 seconds.  Neurological:     Mental Status: He is alert. Mental status is at baseline.     GCS: GCS eye subscore is 4. GCS verbal subscore is 5. GCS motor subscore is 6.     Sensory: Sensation is intact.     Motor: Motor function is intact. No weakness, tremor or  pronator drift.     Coordination: Coordination is intact.     Gait: Gait is intact.     Comments: No disconjugate gaze  Psychiatric:        Mood and Affect: Mood normal.      ED Treatments / Results  Labs (all labs ordered are listed, but only abnormal results are displayed) Labs Reviewed  CBC WITH DIFFERENTIAL/PLATELET - Abnormal; Notable for the following components:      Result Value   RBC 5.93 (*)    MCV 75.2 (*)    [facility]  23.9 (*)    RDW 16.4 (*)    All other components within normal limits  BASIC METABOLIC PANEL  TROPONIN I  BRAIN NATRIURETIC PEPTIDE    EKG None   EKG read by Dr. Estell Harpin at 17:45.     Radiology Ct Head Wo Contrast  Result Date: 11/08/2018 CLINICAL DATA:  Headache, hypertension EXAM: CT HEAD WITHOUT CONTRAST TECHNIQUE: Contiguous axial images were obtained from the base of the skull through the vertex without intravenous contrast. COMPARISON:  MRI 01/28/2016 FINDINGS: BRAIN: The ventricles and sulci are normal. No intraparenchymal hemorrhage, mass effect nor midline shift. No acute large vascular territory infarcts. Grey-white matter distinction is maintained. The basal ganglia are unremarkable. No abnormal extra-axial fluid collections. Basal cisterns are not effaced and midline. The brainstem and cerebellar hemispheres are without acute abnormalities. VASCULAR: No hyperdense vessel sign or unexpected calcifications. SKULL/SOFT TISSUES: No skull fracture. No significant soft tissue swelling. ORBITS/SINUSES: The included ocular globes and orbital contents are normal.The mastoid air cells are clear. Mild left maxillary sinus mucosal thickening. Intact orbits and globes. OTHER: Clear bilateral mastoids. IMPRESSION: Normal head CT. Electronically Signed   By: Tollie Eth M.D.   On: 11/08/2018 19:45    Procedures Procedures (including critical care time)  Medications Ordered in ED Medications  hydrALAZINE (APRESOLINE) injection 5 mg (5 mg Intravenous Given  11/08/18 1802)  hydrALAZINE (APRESOLINE) injection 5 mg (5 mg Intravenous Given 11/08/18 1902)     Initial Impression / Assessment and Plan / ED Course  I have reviewed the triage vital signs and the nursing notes.  Pertinent labs & imaging results that were available during my care of the patient were reviewed by me and considered in my medical decision making (see chart for details).      Today's Vitals   11/08/18 1954 11/08/18 2000 11/08/18 2015 11/08/18 2030  BP: (!) 174/132 (!) 178/126 (!) 193/127 (!) 192/124  Pulse: (!) 108 (!) 118 (!) 108   Resp: (!) 25 (!) 28 20   Temp:      TempSrc:      SpO2: 100% 100% 100%   Weight:      Height:      PainSc:       Body mass index is 30.3 kg/m.    Pt with poorly controlled hypertension and non-compliance of taking his blood pressure medications for 1 month.  He was seen at the health department earlier today and referred here for hypertensive urgency.  Patient is currently symptom-free.  Blood pressure has improved slightly after IV hydralazine.  Work-up is reassuring and without evidence of endorgan damage, visual deficit of left eye is chronic, there are no new neurological deficits on exam today. No complaints of chest pain or dyspnea during visit.  Have strongly counseled the patient on the importance of blood pressure control and risks associated with his continued non-compliance.    Discussed work up and care plan with Dr. Estell Harpin.  Patient's work-up today is reassuring and he is felt to be stable for discharge home which he prefers.  He does have an appointment tomorrow morning at 10 AM with the health department for f/u.  I will refill his previous blood pressure medications with the understanding that he will keep his appt. tomorrow  He agrees to this and verbalized understanding. Strict return also precautions given.      Final Clinical Impressions(s) / ED Diagnoses   Final diagnoses:  Essential hypertension  Non compliance w  medication regimen  ED Discharge Orders    None       Rosey Bath 11/08/18 2146    Bethann Berkshire, MD 11/09/18 [phone]

## 2019-04-17 ENCOUNTER — Telehealth: Payer: Self-pay

## 2023-04-28 ENCOUNTER — Ambulatory Visit (HOSPITAL_COMMUNITY)
Admission: RE | Admit: 2023-04-28 | Discharge: 2023-04-28 | Disposition: A | Discharge status: Home / Self Care | Payer: Self-pay | Source: Ambulatory Visit | Attending: Physician Assistant | Admitting: Physician Assistant

## 2023-04-28 ENCOUNTER — Other Ambulatory Visit (HOSPITAL_COMMUNITY): Payer: Self-pay | Admitting: Physician Assistant

## 2023-04-28 DIAGNOSIS — M25572 Pain in left ankle and joints of left foot: Secondary | ICD-10-CM | POA: Insufficient documentation

## 2023-09-02 ENCOUNTER — Emergency Department (HOSPITAL_COMMUNITY)
Admission: EM | Admit: 2023-09-02 | Discharge: 2023-09-02 | Disposition: A | Discharge status: Home / Self Care | Payer: Self-pay | Attending: Emergency Medicine | Admitting: Emergency Medicine

## 2023-09-02 ENCOUNTER — Encounter (HOSPITAL_COMMUNITY): Payer: Self-pay

## 2023-09-02 ENCOUNTER — Other Ambulatory Visit: Payer: Self-pay

## 2023-09-02 DIAGNOSIS — K047 Periapical abscess without sinus: Secondary | ICD-10-CM | POA: Insufficient documentation

## 2023-09-02 DIAGNOSIS — Z7982 Long term (current) use of aspirin: Secondary | ICD-10-CM | POA: Insufficient documentation

## 2023-09-02 MED ORDER — CLINDAMYCIN HCL 150 MG PO CAPS: 450.0000 mg | ORAL_CAPSULE | Freq: Three times a day (TID) | ORAL | 0 refills | Status: AC

## 2023-09-02 NOTE — ED Provider Notes (Signed)
[place] EMERGENCY DEPARTMENT AT [facility] Provider Note   CSN: [id number] Arrival date & time: 09/02/23  1421     History  Chief Complaint  Patient presents with   Oral Swelling    John Cain is a 42 y.o. male who presents with left-sided dental pain and swelling.  Patient states that the dental pain started last night without any injury or trauma.  He woke this morning with notable swelling to to the left side of his face.  He denies any difficulty swallowing or breathing.  Denies any fevers, chills or any other systemic symptoms.  Denies prior history of dental issues.  Denies any drooling or difficulty opening his mouth or protruding his tongue  HPI     Home Medications Prior to Admission medications   Medication Sig Start Date End Date Taking? Authorizing Provider  clindamycin (CLEOCIN) 150 MG capsule Take 3 capsules (450 mg total) by mouth 3 (three) times daily. 09/02/23  Yes Halford Decamp, PA-C  amLODipine (NORVASC) 5 MG tablet Take 1 tablet (5 mg total) by mouth daily. 11/04/17   Burgess Amor, PA-C  aspirin 81 MG EC tablet Take 1 tablet (81 mg total) by mouth daily. 09/27/16   Jacalyn Lefevre, MD  carvedilol (COREG) 12.5 MG tablet Take 1 tablet (12.5 mg total) by mouth 2 (two) times daily with a meal. 11/08/18   Triplett, Tammy, PA-C  chlorthalidone (HYGROTON) 25 MG tablet Take 1 tablet (25 mg total) by mouth daily. 09/27/16   Jacalyn Lefevre, MD  losartan (COZAAR) 50 MG tablet Take 1 tablet (50 mg total) by mouth daily. 11/08/18   Triplett, Tammy, PA-C  naproxen (NAPROSYN) 500 MG tablet Take 1 tablet (500 mg total) by mouth 2 (two) times daily. 12/20/17   Glynn Octave, MD      Allergies    Bee venom    Review of Systems   Review of Systems  HENT:  Positive for dental problem.     Physical Exam Updated Vital Signs BP (!) 152/106 (BP Location: Right Arm)   Pulse (!) 112   Temp 99 F (37.2 C) (Oral)   Resp 14   Ht 6\' 2"  (1.88 m)   Wt 104.3  kg   SpO2 95%   BMI 29.53 kg/m  Physical Exam Vitals and nursing note reviewed.  Constitutional:      General: He is not in acute distress.    Appearance: He is well-developed.  HENT:     Head: Normocephalic and atraumatic.     Mouth/Throat:     Comments: Obvious left-sided facial swelling without erythema or warmth.  Actively draining upper left gingival abscess visualized at approximately the first premolar.  Not grossly fluctuant.  No sublingual tenderness or anterior cervical tenderness or lymphadenopathy. Eyes:     Conjunctiva/sclera: Conjunctivae normal.  Cardiovascular:     Rate and Rhythm: Normal rate and regular rhythm.     Heart sounds: No murmur heard. Pulmonary:     Effort: Pulmonary effort is normal. No respiratory distress.     Breath sounds: Normal breath sounds.  Abdominal:     Palpations: Abdomen is soft.     Tenderness: There is no abdominal tenderness.  Musculoskeletal:        General: No swelling.     Cervical back: Neck supple.  Skin:    General: Skin is warm and dry.     Capillary Refill: Capillary refill takes less than 2 seconds.  Neurological:     Mental  Status: He is alert.  Psychiatric:        Mood and Affect: Mood normal.     ED Results / Procedures / Treatments   Labs (all labs ordered are listed, but only abnormal results are displayed) Labs Reviewed - No data to display  EKG None  Radiology No results found.  Procedures Procedures    Medications Ordered in ED Medications - No data to display  ED Course/ Medical Decision Making/ A&P                                 Medical Decision Making  This patient presents to the ED with chief complaint(s) of facial swelling and dental pain with no pertinent past medical history the complaint involves an extensive differential diagnosis and also carries with it a high risk of complications and morbidity.    The differential diagnosis includes  Dental abscess, cellulitis, sialadenitis,  mumps, Ludwig's angina  Additional history obtained: No additional historians or records  Initial Assessment:   Clinical exam is consistent with dental abscess.  Will require antibiotics.  Abscess is currently draining no indication for I&D.  Independent ECG/labs interpretation:  No labs or ECG indicated  Independent visualization and interpretation of imaging: No evidence of further complicated abscess.  No imaging indicated  Consultations obtained:   None  Reevaluation: No medications given during patient's ED visit  Disposition:   Patient will be discharged on 10 days of clindamycin 450 mg 3 times daily with close follow-up dentist within the next 10 days. The patient has been appropriately medically screened and/or stabilized in the ED. I have low suspicion for any other emergent medical condition which would require further screening, evaluation or treatment in the ED or require inpatient management. At time of discharge the patient is hemodynamically stable and in no acute distress. I have discussed work-up results and diagnosis with patient and answered all questions. Patient is agreeable with discharge plan. We discussed strict return precautions for returning to the emergency department and they verbalized understanding.     Social Determinants of Health:   None         Final Clinical Impression(s) / ED Diagnoses Final diagnoses:  Dental abscess    Rx / DC Orders ED Discharge Orders          Ordered    clindamycin (CLEOCIN) 150 MG capsule  3 times daily        09/02/23 1529              Fabienne Bruns 09/02/23 1620    Eber Hong, MD 09/03/23 1128

## 2023-09-02 NOTE — ED Triage Notes (Signed)
Pt c/o left side oral swelling that started this morning. Pt cheek is swollen. Pt took tylenol extra strength with no relief.

## 2023-09-02 NOTE — Discharge Instructions (Addendum)
It was a pleasure taking care of you this afternoon.  You were evaluated in the emergency department for facial swelling and dental pain.  On exam you have a dental abscess that will be treated with antibiotic for 10 days.  Please be sure to see your dentist within 10 days.  If you experience any new or worsening symptoms including fevers, chills, worsening swelling, or pain please return to the emergency department.

## 2024-03-26 ENCOUNTER — Telehealth: Payer: Self-pay | Admitting: Family Medicine

## 2024-03-26 DIAGNOSIS — K047 Periapical abscess without sinus: Secondary | ICD-10-CM

## 2024-03-26 MED ORDER — PENICILLIN V POTASSIUM 500 MG PO TABS: 500.0000 mg | ORAL_TABLET | Freq: Three times a day (TID) | ORAL | 0 refills | Status: AC

## 2024-03-26 MED ORDER — NAPROXEN 500 MG PO TABS: 500.0000 mg | ORAL_TABLET | Freq: Two times a day (BID) | ORAL | 0 refills | Status: AC

## 2024-03-26 NOTE — Progress Notes (Signed)
